# Patient Record
Sex: Female | Born: 1935 | Hispanic: No | Smoking: Former smoker
Health system: Southern US, Community
[De-identification: ages and names within clinical notes are randomized; demographics above are authoritative.]

## PROBLEM LIST (undated history)

## (undated) DIAGNOSIS — N390 Urinary tract infection, site not specified: Secondary | ICD-10-CM

## (undated) DIAGNOSIS — M542 Cervicalgia: Secondary | ICD-10-CM

## (undated) DIAGNOSIS — S72009A Fracture of unspecified part of neck of unspecified femur, initial encounter for closed fracture: Secondary | ICD-10-CM

## (undated) DIAGNOSIS — R1310 Dysphagia, unspecified: Secondary | ICD-10-CM

## (undated) DIAGNOSIS — I255 Ischemic cardiomyopathy: Secondary | ICD-10-CM

## (undated) DIAGNOSIS — F0390 Unspecified dementia without behavioral disturbance: Secondary | ICD-10-CM

## (undated) DIAGNOSIS — J8410 Pulmonary fibrosis, unspecified: Secondary | ICD-10-CM

## (undated) DIAGNOSIS — G4700 Insomnia, unspecified: Secondary | ICD-10-CM

## (undated) DIAGNOSIS — R82998 Other abnormal findings in urine: Secondary | ICD-10-CM

## (undated) DIAGNOSIS — R05 Cough: Secondary | ICD-10-CM

## (undated) DIAGNOSIS — M1990 Unspecified osteoarthritis, unspecified site: Secondary | ICD-10-CM

## (undated) DIAGNOSIS — Z79899 Other long term (current) drug therapy: Secondary | ICD-10-CM

## (undated) DIAGNOSIS — H3530 Unspecified macular degeneration: Secondary | ICD-10-CM

## (undated) DIAGNOSIS — I2510 Atherosclerotic heart disease of native coronary artery without angina pectoris: Secondary | ICD-10-CM

## (undated) DIAGNOSIS — Z8679 Personal history of other diseases of the circulatory system: Secondary | ICD-10-CM

## (undated) DIAGNOSIS — L989 Disorder of the skin and subcutaneous tissue, unspecified: Secondary | ICD-10-CM

## (undated) DIAGNOSIS — G459 Transient cerebral ischemic attack, unspecified: Secondary | ICD-10-CM

## (undated) DIAGNOSIS — J449 Chronic obstructive pulmonary disease, unspecified: Secondary | ICD-10-CM

## (undated) DIAGNOSIS — J189 Pneumonia, unspecified organism: Secondary | ICD-10-CM

## (undated) DIAGNOSIS — R634 Abnormal weight loss: Secondary | ICD-10-CM

## (undated) DIAGNOSIS — K5900 Constipation, unspecified: Secondary | ICD-10-CM

## (undated) DIAGNOSIS — IMO0002 Reserved for concepts with insufficient information to code with codable children: Secondary | ICD-10-CM

## (undated) DIAGNOSIS — Z9289 Personal history of other medical treatment: Secondary | ICD-10-CM

## (undated) DIAGNOSIS — W19XXXA Unspecified fall, initial encounter: Secondary | ICD-10-CM

## (undated) DIAGNOSIS — I509 Heart failure, unspecified: Secondary | ICD-10-CM

## (undated) DIAGNOSIS — K219 Gastro-esophageal reflux disease without esophagitis: Secondary | ICD-10-CM

## (undated) DIAGNOSIS — E7800 Pure hypercholesterolemia, unspecified: Secondary | ICD-10-CM

## (undated) DIAGNOSIS — R011 Cardiac murmur, unspecified: Secondary | ICD-10-CM

## (undated) DIAGNOSIS — L299 Pruritus, unspecified: Secondary | ICD-10-CM

## (undated) DIAGNOSIS — R269 Unspecified abnormalities of gait and mobility: Secondary | ICD-10-CM

## (undated) DIAGNOSIS — E785 Hyperlipidemia, unspecified: Secondary | ICD-10-CM

## (undated) HISTORY — DX: Reserved for concepts with insufficient information to code with codable children: IMO0002

## (undated) HISTORY — DX: Unspecified macular degeneration: H35.30

## (undated) HISTORY — PX: CHOLECYSTECTOMY: SHX55

## (undated) HISTORY — DX: Dysphagia, unspecified: R13.10

## (undated) HISTORY — DX: Hyperlipidemia, unspecified: E78.5

## (undated) HISTORY — DX: Disorder of the skin and subcutaneous tissue, unspecified: L98.9

## (undated) HISTORY — DX: Urinary tract infection, site not specified: N39.0

## (undated) HISTORY — DX: Pruritus, unspecified: L29.9

## (undated) HISTORY — DX: Cervicalgia: M54.2

## (undated) HISTORY — DX: Constipation, unspecified: K59.00

## (undated) HISTORY — DX: Pulmonary fibrosis, unspecified: J84.10

## (undated) HISTORY — DX: Abnormal weight loss: R63.4

## (undated) HISTORY — DX: Pneumonia, unspecified organism: J18.9

## (undated) HISTORY — DX: Other long term (current) drug therapy: Z79.899

## (undated) HISTORY — PX: OOPHORECTOMY: SHX86

## (undated) HISTORY — DX: Cardiac murmur, unspecified: R01.1

## (undated) HISTORY — DX: Chronic obstructive pulmonary disease, unspecified: J44.9

## (undated) HISTORY — DX: Atherosclerotic heart disease of native coronary artery without angina pectoris: I25.10

## (undated) HISTORY — DX: Cough: R05

## (undated) HISTORY — DX: Unspecified osteoarthritis, unspecified site: M19.90

## (undated) HISTORY — DX: Unspecified fall, initial encounter: W19.XXXA

## (undated) HISTORY — DX: Gastro-esophageal reflux disease without esophagitis: K21.9

## (undated) HISTORY — PX: SPINAL FUSION: SHX223

## (undated) HISTORY — DX: Unspecified dementia without behavioral disturbance: F03.90

## (undated) HISTORY — DX: Unspecified dementia, unspecified severity, without behavioral disturbance, psychotic disturbance, mood disturbance, and anxiety: F03.90

## (undated) HISTORY — DX: Insomnia, unspecified: G47.00

## (undated) HISTORY — DX: Heart failure, unspecified: I50.9

## (undated) HISTORY — DX: Fracture of unspecified part of neck of unspecified femur, initial encounter for closed fracture: S72.009A

## (undated) HISTORY — DX: Pure hypercholesterolemia, unspecified: E78.00

## (undated) HISTORY — DX: Personal history of other diseases of the circulatory system: Z86.79

## (undated) HISTORY — DX: Other abnormal findings in urine: R82.998

## (undated) HISTORY — DX: Ischemic cardiomyopathy: I25.5

## (undated) HISTORY — DX: Unspecified abnormalities of gait and mobility: R26.9

## (undated) HISTORY — DX: Personal history of other medical treatment: Z92.89

## (undated) HISTORY — DX: Transient cerebral ischemic attack, unspecified: G45.9

## (undated) HISTORY — PX: ABDOMINAL HYSTERECTOMY: SHX81

---

## 2001-08-18 ENCOUNTER — Emergency Department (HOSPITAL_COMMUNITY): Admission: EM | Admit: 2001-08-18 | Discharge: 2001-08-18 | Payer: Self-pay | Admitting: Emergency Medicine

## 2001-08-18 ENCOUNTER — Encounter: Payer: Self-pay | Admitting: Emergency Medicine

## 2003-04-22 ENCOUNTER — Encounter: Admission: RE | Admit: 2003-04-22 | Discharge: 2003-07-21 | Payer: Self-pay | Admitting: *Deleted

## 2003-04-24 ENCOUNTER — Ambulatory Visit (HOSPITAL_COMMUNITY): Admission: RE | Admit: 2003-04-24 | Discharge: 2003-04-24 | Payer: Self-pay | Admitting: *Deleted

## 2003-04-24 ENCOUNTER — Encounter: Payer: Self-pay | Admitting: Neurology

## 2003-12-28 ENCOUNTER — Inpatient Hospital Stay (HOSPITAL_COMMUNITY): Admission: EM | Admit: 2003-12-28 | Discharge: 2004-01-05 | Payer: Self-pay | Admitting: Emergency Medicine

## 2003-12-31 ENCOUNTER — Encounter: Payer: Self-pay | Admitting: Cardiology

## 2004-01-05 ENCOUNTER — Inpatient Hospital Stay: Admission: RE | Admit: 2004-01-05 | Discharge: 2004-01-14 | Payer: Self-pay | Admitting: Internal Medicine

## 2004-10-25 ENCOUNTER — Ambulatory Visit: Payer: Self-pay | Admitting: *Deleted

## 2005-02-19 ENCOUNTER — Ambulatory Visit: Payer: Self-pay | Admitting: *Deleted

## 2005-07-20 ENCOUNTER — Ambulatory Visit: Payer: Self-pay | Admitting: Cardiology

## 2005-12-12 ENCOUNTER — Ambulatory Visit: Payer: Self-pay | Admitting: Cardiology

## 2006-07-01 ENCOUNTER — Ambulatory Visit: Payer: Self-pay | Admitting: Cardiology

## 2006-10-21 ENCOUNTER — Ambulatory Visit: Payer: Self-pay | Admitting: Cardiology

## 2006-10-23 ENCOUNTER — Ambulatory Visit: Payer: Self-pay | Admitting: Internal Medicine

## 2006-11-01 ENCOUNTER — Ambulatory Visit: Payer: Self-pay | Admitting: Internal Medicine

## 2006-11-01 ENCOUNTER — Encounter: Admission: RE | Admit: 2006-11-01 | Discharge: 2006-11-01 | Payer: Self-pay | Admitting: Internal Medicine

## 2006-11-07 ENCOUNTER — Ambulatory Visit: Payer: Self-pay | Admitting: Internal Medicine

## 2006-11-18 ENCOUNTER — Ambulatory Visit: Payer: Self-pay | Admitting: Family Medicine

## 2006-12-30 ENCOUNTER — Ambulatory Visit: Payer: Self-pay | Admitting: Internal Medicine

## 2006-12-31 ENCOUNTER — Encounter: Payer: Self-pay | Admitting: Internal Medicine

## 2006-12-31 LAB — CONVERTED CEMR LAB
Hemoglobin, Urine: NEGATIVE
Specific Gravity, Urine: 1.012 (ref 1.005–1.03)
Urobilinogen, UA: 0.2 (ref 0.0–1.0)
pH: 6 (ref 5.0–8.0)

## 2007-01-01 ENCOUNTER — Encounter: Payer: Self-pay | Admitting: Internal Medicine

## 2007-05-01 ENCOUNTER — Ambulatory Visit: Payer: Self-pay | Admitting: Cardiology

## 2007-05-12 DIAGNOSIS — J4489 Other specified chronic obstructive pulmonary disease: Secondary | ICD-10-CM

## 2007-05-12 DIAGNOSIS — M1990 Unspecified osteoarthritis, unspecified site: Secondary | ICD-10-CM

## 2007-05-12 DIAGNOSIS — J449 Chronic obstructive pulmonary disease, unspecified: Secondary | ICD-10-CM

## 2007-05-12 DIAGNOSIS — I509 Heart failure, unspecified: Secondary | ICD-10-CM

## 2007-05-12 HISTORY — DX: Unspecified osteoarthritis, unspecified site: M19.90

## 2007-05-12 HISTORY — DX: Other specified chronic obstructive pulmonary disease: J44.89

## 2007-05-12 HISTORY — DX: Heart failure, unspecified: I50.9

## 2007-05-12 HISTORY — DX: Chronic obstructive pulmonary disease, unspecified: J44.9

## 2007-05-16 ENCOUNTER — Encounter (INDEPENDENT_AMBULATORY_CARE_PROVIDER_SITE_OTHER): Payer: Self-pay | Admitting: *Deleted

## 2007-05-22 ENCOUNTER — Ambulatory Visit: Payer: Self-pay | Admitting: Internal Medicine

## 2007-05-22 DIAGNOSIS — J8410 Pulmonary fibrosis, unspecified: Secondary | ICD-10-CM | POA: Insufficient documentation

## 2007-05-22 DIAGNOSIS — H3530 Unspecified macular degeneration: Secondary | ICD-10-CM

## 2007-05-22 DIAGNOSIS — E785 Hyperlipidemia, unspecified: Secondary | ICD-10-CM | POA: Insufficient documentation

## 2007-05-22 DIAGNOSIS — N390 Urinary tract infection, site not specified: Secondary | ICD-10-CM | POA: Insufficient documentation

## 2007-05-22 HISTORY — DX: Urinary tract infection, site not specified: N39.0

## 2007-05-22 HISTORY — DX: Pulmonary fibrosis, unspecified: J84.10

## 2007-05-22 HISTORY — DX: Hyperlipidemia, unspecified: E78.5

## 2007-05-22 HISTORY — DX: Unspecified macular degeneration: H35.30

## 2007-05-22 LAB — CONVERTED CEMR LAB
Glucose, Urine, Semiquant: NEGATIVE
Ketones, urine, test strip: NEGATIVE
Protein, U semiquant: NEGATIVE
Urobilinogen, UA: NEGATIVE
pH: 5

## 2007-05-23 ENCOUNTER — Encounter: Payer: Self-pay | Admitting: Internal Medicine

## 2007-05-28 ENCOUNTER — Telehealth: Payer: Self-pay | Admitting: Internal Medicine

## 2007-08-15 ENCOUNTER — Ambulatory Visit: Payer: Self-pay | Admitting: Internal Medicine

## 2007-08-15 DIAGNOSIS — F0390 Unspecified dementia without behavioral disturbance: Secondary | ICD-10-CM

## 2007-08-15 HISTORY — DX: Unspecified dementia, unspecified severity, without behavioral disturbance, psychotic disturbance, mood disturbance, and anxiety: F03.90

## 2007-08-15 LAB — CONVERTED CEMR LAB
Bilirubin Urine: NEGATIVE
Nitrite: NEGATIVE
Protein, U semiquant: NEGATIVE
Specific Gravity, Urine: 1.01
Urobilinogen, UA: NEGATIVE
pH: 6

## 2007-08-18 LAB — CONVERTED CEMR LAB
ALT: 13 units/L (ref 0–35)
AST: 18 units/L (ref 0–37)
Calcium: 10.2 mg/dL (ref 8.4–10.5)
HCT: 43.3 % (ref 36.0–46.0)
HDL: 37 mg/dL — ABNORMAL LOW (ref >39)
Hemoglobin: 14.1 g/dL (ref 12.0–15.0)
Lymphocytes Relative: 24 % (ref 12–46)
Monocytes Relative: 8 % (ref 3–11)
Neutrophils Relative %: 66 % (ref 43–77)
Potassium: 5 meq/L (ref 3.5–5.3)
RDW: 13.8 % (ref 11.5–14.0)
Sodium: 142 meq/L (ref 135–145)
TSH: 1.581 microintl units/mL (ref 0.350–5.50)
Total CHOL/HDL Ratio: 4.4
WBC: 9.2 10*3/uL (ref 4.0–10.5)

## 2007-08-25 ENCOUNTER — Telehealth (INDEPENDENT_AMBULATORY_CARE_PROVIDER_SITE_OTHER): Payer: Self-pay | Admitting: *Deleted

## 2007-09-04 ENCOUNTER — Ambulatory Visit: Payer: Self-pay | Admitting: Cardiology

## 2007-09-29 ENCOUNTER — Ambulatory Visit: Payer: Self-pay | Admitting: Internal Medicine

## 2007-09-29 LAB — CONVERTED CEMR LAB
Bilirubin Urine: NEGATIVE
Blood in Urine, dipstick: NEGATIVE
Glucose, Urine, Semiquant: NEGATIVE
Ketones, urine, test strip: NEGATIVE
Specific Gravity, Urine: 1.01

## 2007-10-24 ENCOUNTER — Ambulatory Visit: Payer: Self-pay | Admitting: Internal Medicine

## 2007-10-25 ENCOUNTER — Encounter: Payer: Self-pay | Admitting: Internal Medicine

## 2007-10-29 ENCOUNTER — Telehealth: Payer: Self-pay | Admitting: Internal Medicine

## 2007-11-04 ENCOUNTER — Telehealth: Payer: Self-pay | Admitting: Internal Medicine

## 2007-12-12 ENCOUNTER — Ambulatory Visit: Payer: Self-pay | Admitting: Internal Medicine

## 2007-12-12 LAB — CONVERTED CEMR LAB
Bilirubin Urine: NEGATIVE
Blood in Urine, dipstick: NEGATIVE
Nitrite: NEGATIVE
Protein, U semiquant: NEGATIVE
Specific Gravity, Urine: 1.015
Urobilinogen, UA: NEGATIVE
WBC Urine, dipstick: NEGATIVE

## 2007-12-16 ENCOUNTER — Encounter (INDEPENDENT_AMBULATORY_CARE_PROVIDER_SITE_OTHER): Payer: Self-pay | Admitting: *Deleted

## 2007-12-16 LAB — CONVERTED CEMR LAB
BUN: 19 mg/dL (ref 6–23)
CO2: 31 meq/L (ref 19–32)
Calcium: 10.6 mg/dL — ABNORMAL HIGH (ref 8.4–10.5)
Chloride: 102 meq/L (ref 96–112)
Creatinine, Ser: 1 mg/dL (ref 0.4–1.2)
Glucose, Bld: 110 mg/dL — ABNORMAL HIGH (ref 70–99)

## 2007-12-29 ENCOUNTER — Telehealth: Payer: Self-pay | Admitting: Internal Medicine

## 2007-12-31 ENCOUNTER — Ambulatory Visit: Payer: Self-pay | Admitting: Internal Medicine

## 2007-12-31 DIAGNOSIS — K219 Gastro-esophageal reflux disease without esophagitis: Secondary | ICD-10-CM

## 2007-12-31 HISTORY — DX: Gastro-esophageal reflux disease without esophagitis: K21.9

## 2008-01-16 ENCOUNTER — Ambulatory Visit: Payer: Self-pay | Admitting: Internal Medicine

## 2008-01-16 LAB — CONVERTED CEMR LAB
Bacteria, UA: NONE SEEN
Bilirubin Urine: NEGATIVE
Ketones, urine, test strip: NEGATIVE
Nitrite: NEGATIVE
Protein, U semiquant: NEGATIVE
RBC / HPF: NONE SEEN (ref <3)
Urobilinogen, UA: NEGATIVE

## 2008-01-17 ENCOUNTER — Encounter: Payer: Self-pay | Admitting: Internal Medicine

## 2008-01-20 ENCOUNTER — Telehealth (INDEPENDENT_AMBULATORY_CARE_PROVIDER_SITE_OTHER): Payer: Self-pay | Admitting: *Deleted

## 2008-01-23 ENCOUNTER — Encounter: Payer: Self-pay | Admitting: Internal Medicine

## 2008-02-05 ENCOUNTER — Telehealth: Payer: Self-pay | Admitting: Internal Medicine

## 2008-02-12 ENCOUNTER — Telehealth: Payer: Self-pay | Admitting: Internal Medicine

## 2008-03-01 ENCOUNTER — Ambulatory Visit: Payer: Self-pay | Admitting: Cardiology

## 2008-03-09 ENCOUNTER — Encounter: Payer: Self-pay | Admitting: Internal Medicine

## 2008-03-15 ENCOUNTER — Telehealth: Payer: Self-pay | Admitting: Internal Medicine

## 2008-03-31 ENCOUNTER — Encounter: Payer: Self-pay | Admitting: Internal Medicine

## 2008-04-02 ENCOUNTER — Encounter: Payer: Self-pay | Admitting: Internal Medicine

## 2008-04-02 ENCOUNTER — Encounter (INDEPENDENT_AMBULATORY_CARE_PROVIDER_SITE_OTHER): Payer: Self-pay | Admitting: *Deleted

## 2008-04-02 ENCOUNTER — Telehealth (INDEPENDENT_AMBULATORY_CARE_PROVIDER_SITE_OTHER): Payer: Self-pay | Admitting: *Deleted

## 2008-04-02 LAB — CONVERTED CEMR LAB
Glucose, Urine, Semiquant: NEGATIVE
Ketones, urine, test strip: NEGATIVE
Nitrite: POSITIVE
Specific Gravity, Urine: <1.005
pH: 7

## 2008-04-03 ENCOUNTER — Encounter: Payer: Self-pay | Admitting: Internal Medicine

## 2008-04-03 LAB — CONVERTED CEMR LAB
Bilirubin Urine: NEGATIVE
Ketones, ur: NEGATIVE mg/dL
Nitrite: POSITIVE — AB
Protein, ur: NEGATIVE mg/dL
RBC / HPF: NONE SEEN (ref <3)
Specific Gravity, Urine: 1.013 (ref 1.005–1.03)
Urobilinogen, UA: 0.2 (ref 0.0–1.0)

## 2008-04-08 ENCOUNTER — Telehealth (INDEPENDENT_AMBULATORY_CARE_PROVIDER_SITE_OTHER): Payer: Self-pay | Admitting: *Deleted

## 2008-04-28 ENCOUNTER — Ambulatory Visit: Payer: Self-pay | Admitting: Internal Medicine

## 2008-04-28 DIAGNOSIS — G4700 Insomnia, unspecified: Secondary | ICD-10-CM | POA: Insufficient documentation

## 2008-04-28 HISTORY — DX: Insomnia, unspecified: G47.00

## 2008-04-28 LAB — CONVERTED CEMR LAB
Bilirubin Urine: NEGATIVE
Ketones, urine, test strip: NEGATIVE
Nitrite: NEGATIVE
Protein, U semiquant: NEGATIVE
Specific Gravity, Urine: <1.005
Urobilinogen, UA: 0.2
pH: 5

## 2008-05-03 ENCOUNTER — Encounter (INDEPENDENT_AMBULATORY_CARE_PROVIDER_SITE_OTHER): Payer: Self-pay | Admitting: *Deleted

## 2008-05-03 LAB — CONVERTED CEMR LAB
Calcium: 10.1 mg/dL (ref 8.4–10.5)
Creatinine, Ser: 0.8 mg/dL (ref 0.4–1.2)
Folate: >20 ng/mL
GFR calc Af Amer: 88 mL/min
GFR calc non Af Amer: 72 mL/min
Hemoglobin: 13.2 g/dL (ref 12.0–15.0)
TSH: 1.86 microintl units/mL (ref 0.35–5.50)
Total Bilirubin: 0.7 mg/dL (ref 0.3–1.2)
Vitamin B-12: 326 pg/mL (ref 211–911)

## 2008-05-10 ENCOUNTER — Encounter: Payer: Self-pay | Admitting: Internal Medicine

## 2008-05-10 ENCOUNTER — Telehealth: Payer: Self-pay | Admitting: Internal Medicine

## 2008-05-13 ENCOUNTER — Telehealth (INDEPENDENT_AMBULATORY_CARE_PROVIDER_SITE_OTHER): Payer: Self-pay | Admitting: *Deleted

## 2008-05-18 ENCOUNTER — Encounter: Payer: Self-pay | Admitting: Internal Medicine

## 2008-05-18 ENCOUNTER — Telehealth (INDEPENDENT_AMBULATORY_CARE_PROVIDER_SITE_OTHER): Payer: Self-pay | Admitting: *Deleted

## 2008-06-11 ENCOUNTER — Encounter: Payer: Self-pay | Admitting: Internal Medicine

## 2008-06-14 ENCOUNTER — Telehealth (INDEPENDENT_AMBULATORY_CARE_PROVIDER_SITE_OTHER): Payer: Self-pay | Admitting: *Deleted

## 2008-06-14 ENCOUNTER — Encounter: Payer: Self-pay | Admitting: Internal Medicine

## 2008-06-14 ENCOUNTER — Ambulatory Visit: Payer: Self-pay | Admitting: Family Medicine

## 2008-06-14 LAB — CONVERTED CEMR LAB
Bilirubin Urine: NEGATIVE
Ketones, urine, test strip: NEGATIVE
Specific Gravity, Urine: 1.01

## 2008-06-15 ENCOUNTER — Encounter: Payer: Self-pay | Admitting: Internal Medicine

## 2008-06-15 LAB — CONVERTED CEMR LAB

## 2008-06-17 ENCOUNTER — Telehealth: Payer: Self-pay | Admitting: Internal Medicine

## 2008-06-21 ENCOUNTER — Telehealth (INDEPENDENT_AMBULATORY_CARE_PROVIDER_SITE_OTHER): Payer: Self-pay | Admitting: *Deleted

## 2008-06-28 ENCOUNTER — Encounter: Payer: Self-pay | Admitting: Internal Medicine

## 2008-07-07 ENCOUNTER — Ambulatory Visit: Payer: Self-pay | Admitting: Internal Medicine

## 2008-07-08 ENCOUNTER — Encounter: Payer: Self-pay | Admitting: Internal Medicine

## 2008-07-08 LAB — CONVERTED CEMR LAB: RBC / HPF: NONE SEEN (ref <3)

## 2008-07-12 ENCOUNTER — Telehealth (INDEPENDENT_AMBULATORY_CARE_PROVIDER_SITE_OTHER): Payer: Self-pay | Admitting: *Deleted

## 2008-07-13 ENCOUNTER — Encounter: Payer: Self-pay | Admitting: Internal Medicine

## 2008-07-20 ENCOUNTER — Encounter: Payer: Self-pay | Admitting: Internal Medicine

## 2008-07-20 HISTORY — PX: HIP FRACTURE SURGERY: SHX118

## 2008-07-27 ENCOUNTER — Telehealth (INDEPENDENT_AMBULATORY_CARE_PROVIDER_SITE_OTHER): Payer: Self-pay | Admitting: *Deleted

## 2008-07-30 ENCOUNTER — Encounter: Payer: Self-pay | Admitting: Internal Medicine

## 2008-08-01 ENCOUNTER — Ambulatory Visit: Payer: Self-pay | Admitting: Internal Medicine

## 2008-08-01 ENCOUNTER — Inpatient Hospital Stay (HOSPITAL_COMMUNITY): Admission: EM | Admit: 2008-08-01 | Discharge: 2008-08-06 | Payer: Self-pay | Admitting: Emergency Medicine

## 2008-08-01 DIAGNOSIS — I2510 Atherosclerotic heart disease of native coronary artery without angina pectoris: Secondary | ICD-10-CM

## 2008-08-01 DIAGNOSIS — S72009A Fracture of unspecified part of neck of unspecified femur, initial encounter for closed fracture: Secondary | ICD-10-CM | POA: Insufficient documentation

## 2008-08-01 HISTORY — DX: Atherosclerotic heart disease of native coronary artery without angina pectoris: I25.10

## 2008-08-04 ENCOUNTER — Encounter: Payer: Self-pay | Admitting: Internal Medicine

## 2008-08-09 ENCOUNTER — Encounter: Payer: Self-pay | Admitting: Internal Medicine

## 2008-08-12 ENCOUNTER — Encounter: Payer: Self-pay | Admitting: Internal Medicine

## 2008-08-19 ENCOUNTER — Inpatient Hospital Stay (HOSPITAL_COMMUNITY): Admission: EM | Admit: 2008-08-19 | Discharge: 2008-08-24 | Payer: Self-pay | Admitting: Emergency Medicine

## 2008-08-25 ENCOUNTER — Encounter: Payer: Self-pay | Admitting: Internal Medicine

## 2008-08-30 ENCOUNTER — Encounter (INDEPENDENT_AMBULATORY_CARE_PROVIDER_SITE_OTHER): Payer: Self-pay | Admitting: *Deleted

## 2008-08-30 ENCOUNTER — Encounter: Payer: Self-pay | Admitting: Internal Medicine

## 2008-09-20 ENCOUNTER — Encounter: Payer: Self-pay | Admitting: Internal Medicine

## 2008-09-29 ENCOUNTER — Ambulatory Visit: Payer: Self-pay | Admitting: Family Medicine

## 2008-09-29 DIAGNOSIS — R05 Cough: Secondary | ICD-10-CM

## 2008-09-29 DIAGNOSIS — R059 Cough, unspecified: Secondary | ICD-10-CM

## 2008-09-29 HISTORY — DX: Cough, unspecified: R05.9

## 2008-09-30 ENCOUNTER — Telehealth: Payer: Self-pay | Admitting: Internal Medicine

## 2008-10-07 ENCOUNTER — Ambulatory Visit: Payer: Self-pay | Admitting: Internal Medicine

## 2008-10-07 LAB — CONVERTED CEMR LAB
Glucose, Urine, Semiquant: NEGATIVE
Ketones, urine, test strip: NEGATIVE
pH: 5

## 2008-10-08 ENCOUNTER — Encounter: Payer: Self-pay | Admitting: Internal Medicine

## 2008-10-15 ENCOUNTER — Encounter: Payer: Self-pay | Admitting: Internal Medicine

## 2008-10-20 ENCOUNTER — Ambulatory Visit: Payer: Self-pay | Admitting: Internal Medicine

## 2008-10-22 ENCOUNTER — Ambulatory Visit: Payer: Self-pay | Admitting: Internal Medicine

## 2008-10-25 ENCOUNTER — Telehealth: Payer: Self-pay | Admitting: Internal Medicine

## 2008-10-25 LAB — CONVERTED CEMR LAB
Basophils Absolute: 0 10*3/uL (ref 0.0–0.1)
Calcium: 10.2 mg/dL (ref 8.4–10.5)
Eosinophils Absolute: 0.1 10*3/uL (ref 0.0–0.7)
GFR calc Af Amer: 122 mL/min
GFR calc non Af Amer: 101 mL/min
HCT: 38.8 % (ref 36.0–46.0)
Hemoglobin: 13.8 g/dL (ref 12.0–15.0)
MCHC: 35.5 g/dL (ref 30.0–36.0)
MCV: 89.7 fL (ref 78.0–100.0)
Monocytes Absolute: 0.7 10*3/uL (ref 0.1–1.0)
Monocytes Relative: 6 % (ref 3.0–12.0)
Neutro Abs: 9.6 10*3/uL — ABNORMAL HIGH (ref 1.4–7.7)
Platelets: 220 10*3/uL (ref 150–400)
Potassium: 4.2 meq/L (ref 3.5–5.1)
RDW: 15.5 % — ABNORMAL HIGH (ref 11.5–14.6)
Sodium: 136 meq/L (ref 135–145)

## 2008-10-26 ENCOUNTER — Ambulatory Visit: Payer: Self-pay | Admitting: Internal Medicine

## 2008-10-27 ENCOUNTER — Encounter: Payer: Self-pay | Admitting: Internal Medicine

## 2008-10-28 ENCOUNTER — Telehealth (INDEPENDENT_AMBULATORY_CARE_PROVIDER_SITE_OTHER): Payer: Self-pay | Admitting: *Deleted

## 2008-10-28 LAB — CONVERTED CEMR LAB

## 2008-11-04 ENCOUNTER — Encounter: Payer: Self-pay | Admitting: Internal Medicine

## 2008-11-30 ENCOUNTER — Telehealth (INDEPENDENT_AMBULATORY_CARE_PROVIDER_SITE_OTHER): Payer: Self-pay | Admitting: *Deleted

## 2008-12-07 ENCOUNTER — Telehealth (INDEPENDENT_AMBULATORY_CARE_PROVIDER_SITE_OTHER): Payer: Self-pay | Admitting: *Deleted

## 2008-12-09 ENCOUNTER — Telehealth: Payer: Self-pay | Admitting: Internal Medicine

## 2008-12-22 ENCOUNTER — Ambulatory Visit: Payer: Self-pay | Admitting: Cardiology

## 2009-01-13 ENCOUNTER — Telehealth (INDEPENDENT_AMBULATORY_CARE_PROVIDER_SITE_OTHER): Payer: Self-pay | Admitting: *Deleted

## 2009-01-17 ENCOUNTER — Encounter: Payer: Self-pay | Admitting: Internal Medicine

## 2009-01-26 ENCOUNTER — Ambulatory Visit: Payer: Self-pay | Admitting: Internal Medicine

## 2009-01-26 LAB — CONVERTED CEMR LAB
Nitrite: NEGATIVE
Protein, U semiquant: NEGATIVE
RBC / HPF: NONE SEEN (ref <3)
Specific Gravity, Urine: 1.015
Urobilinogen, UA: 0.2
pH: 5

## 2009-01-27 ENCOUNTER — Encounter: Payer: Self-pay | Admitting: Internal Medicine

## 2009-02-01 ENCOUNTER — Telehealth: Payer: Self-pay | Admitting: Internal Medicine

## 2009-02-01 ENCOUNTER — Telehealth (INDEPENDENT_AMBULATORY_CARE_PROVIDER_SITE_OTHER): Payer: Self-pay | Admitting: *Deleted

## 2009-02-02 ENCOUNTER — Encounter: Payer: Self-pay | Admitting: Internal Medicine

## 2009-02-04 ENCOUNTER — Ambulatory Visit: Payer: Self-pay | Admitting: Internal Medicine

## 2009-02-14 ENCOUNTER — Telehealth (INDEPENDENT_AMBULATORY_CARE_PROVIDER_SITE_OTHER): Payer: Self-pay | Admitting: *Deleted

## 2009-02-17 ENCOUNTER — Telehealth (INDEPENDENT_AMBULATORY_CARE_PROVIDER_SITE_OTHER): Payer: Self-pay | Admitting: *Deleted

## 2009-02-24 ENCOUNTER — Telehealth: Payer: Self-pay | Admitting: Internal Medicine

## 2009-02-25 ENCOUNTER — Ambulatory Visit: Payer: Self-pay | Admitting: Internal Medicine

## 2009-02-25 ENCOUNTER — Inpatient Hospital Stay (HOSPITAL_COMMUNITY): Admission: EM | Admit: 2009-02-25 | Discharge: 2009-03-03 | Payer: Self-pay | Admitting: Emergency Medicine

## 2009-02-25 ENCOUNTER — Encounter: Payer: Self-pay | Admitting: Internal Medicine

## 2009-03-17 ENCOUNTER — Telehealth: Payer: Self-pay | Admitting: Internal Medicine

## 2009-04-07 ENCOUNTER — Ambulatory Visit: Payer: Self-pay | Admitting: Internal Medicine

## 2009-04-22 ENCOUNTER — Ambulatory Visit: Payer: Self-pay | Admitting: Internal Medicine

## 2009-04-25 ENCOUNTER — Encounter: Payer: Self-pay | Admitting: Internal Medicine

## 2009-04-27 ENCOUNTER — Ambulatory Visit: Payer: Self-pay | Admitting: Internal Medicine

## 2009-04-27 LAB — CONVERTED CEMR LAB
Bilirubin Urine: NEGATIVE
Blood in Urine, dipstick: NEGATIVE
Ketones, urine, test strip: NEGATIVE
Nitrite: NEGATIVE
Urobilinogen, UA: 0.2
pH: 5

## 2009-04-28 ENCOUNTER — Encounter (INDEPENDENT_AMBULATORY_CARE_PROVIDER_SITE_OTHER): Payer: Self-pay | Admitting: *Deleted

## 2009-04-28 ENCOUNTER — Encounter: Payer: Self-pay | Admitting: Internal Medicine

## 2009-05-01 ENCOUNTER — Telehealth: Payer: Self-pay | Admitting: Family Medicine

## 2009-05-01 ENCOUNTER — Encounter: Payer: Self-pay | Admitting: Internal Medicine

## 2009-05-02 ENCOUNTER — Telehealth: Payer: Self-pay | Admitting: Internal Medicine

## 2009-05-02 ENCOUNTER — Encounter: Payer: Self-pay | Admitting: Internal Medicine

## 2009-05-02 LAB — CONVERTED CEMR LAB: WBC, UA: NONE SEEN cells/hpf (ref <3)

## 2009-05-03 ENCOUNTER — Encounter: Payer: Self-pay | Admitting: Internal Medicine

## 2009-05-04 ENCOUNTER — Encounter: Payer: Self-pay | Admitting: Internal Medicine

## 2009-05-06 ENCOUNTER — Telehealth: Payer: Self-pay | Admitting: Internal Medicine

## 2009-05-11 ENCOUNTER — Encounter: Payer: Self-pay | Admitting: Internal Medicine

## 2009-05-13 ENCOUNTER — Encounter: Payer: Self-pay | Admitting: Internal Medicine

## 2009-05-19 ENCOUNTER — Encounter: Payer: Self-pay | Admitting: Internal Medicine

## 2009-05-27 ENCOUNTER — Encounter: Payer: Self-pay | Admitting: Internal Medicine

## 2009-05-30 ENCOUNTER — Ambulatory Visit: Payer: Self-pay | Admitting: Internal Medicine

## 2009-05-30 DIAGNOSIS — L299 Pruritus, unspecified: Secondary | ICD-10-CM

## 2009-05-30 DIAGNOSIS — R634 Abnormal weight loss: Secondary | ICD-10-CM

## 2009-05-30 HISTORY — DX: Pruritus, unspecified: L29.9

## 2009-05-30 HISTORY — DX: Abnormal weight loss: R63.4

## 2009-06-03 ENCOUNTER — Encounter: Payer: Self-pay | Admitting: Internal Medicine

## 2009-06-07 ENCOUNTER — Encounter: Payer: Self-pay | Admitting: Internal Medicine

## 2009-07-13 ENCOUNTER — Encounter: Payer: Self-pay | Admitting: Internal Medicine

## 2009-08-10 ENCOUNTER — Encounter: Payer: Self-pay | Admitting: Internal Medicine

## 2009-08-10 ENCOUNTER — Telehealth: Payer: Self-pay | Admitting: Internal Medicine

## 2009-08-12 DIAGNOSIS — M542 Cervicalgia: Secondary | ICD-10-CM

## 2009-08-12 DIAGNOSIS — Z8679 Personal history of other diseases of the circulatory system: Secondary | ICD-10-CM

## 2009-08-12 HISTORY — DX: Personal history of other diseases of the circulatory system: Z86.79

## 2009-08-12 HISTORY — DX: Cervicalgia: M54.2

## 2009-08-15 ENCOUNTER — Ambulatory Visit: Payer: Self-pay | Admitting: Cardiology

## 2009-08-29 ENCOUNTER — Encounter: Payer: Self-pay | Admitting: Internal Medicine

## 2009-08-29 ENCOUNTER — Telehealth: Payer: Self-pay | Admitting: Internal Medicine

## 2009-08-29 DIAGNOSIS — R82998 Other abnormal findings in urine: Secondary | ICD-10-CM

## 2009-08-29 HISTORY — DX: Other abnormal findings in urine: R82.998

## 2009-09-02 ENCOUNTER — Telehealth (INDEPENDENT_AMBULATORY_CARE_PROVIDER_SITE_OTHER): Payer: Self-pay | Admitting: *Deleted

## 2009-09-14 ENCOUNTER — Encounter: Payer: Self-pay | Admitting: Internal Medicine

## 2009-09-29 ENCOUNTER — Encounter (INDEPENDENT_AMBULATORY_CARE_PROVIDER_SITE_OTHER): Payer: Self-pay | Admitting: *Deleted

## 2009-10-06 ENCOUNTER — Emergency Department (HOSPITAL_COMMUNITY): Admission: EM | Admit: 2009-10-06 | Discharge: 2009-10-06 | Payer: Self-pay | Admitting: Emergency Medicine

## 2009-10-06 ENCOUNTER — Telehealth (INDEPENDENT_AMBULATORY_CARE_PROVIDER_SITE_OTHER): Payer: Self-pay | Admitting: *Deleted

## 2009-10-06 ENCOUNTER — Encounter: Payer: Self-pay | Admitting: Internal Medicine

## 2009-10-07 ENCOUNTER — Telehealth: Payer: Self-pay | Admitting: Internal Medicine

## 2009-10-27 ENCOUNTER — Encounter: Payer: Self-pay | Admitting: Internal Medicine

## 2009-11-07 ENCOUNTER — Ambulatory Visit: Payer: Self-pay | Admitting: Family

## 2009-11-07 DIAGNOSIS — K5900 Constipation, unspecified: Secondary | ICD-10-CM | POA: Insufficient documentation

## 2009-11-07 DIAGNOSIS — R1310 Dysphagia, unspecified: Secondary | ICD-10-CM

## 2009-11-07 DIAGNOSIS — R011 Cardiac murmur, unspecified: Secondary | ICD-10-CM

## 2009-11-07 HISTORY — DX: Dysphagia, unspecified: R13.10

## 2009-11-07 HISTORY — DX: Constipation, unspecified: K59.00

## 2009-11-07 HISTORY — DX: Cardiac murmur, unspecified: R01.1

## 2009-11-09 ENCOUNTER — Encounter: Payer: Self-pay | Admitting: Internal Medicine

## 2009-11-19 DIAGNOSIS — Z9289 Personal history of other medical treatment: Secondary | ICD-10-CM

## 2009-11-19 HISTORY — DX: Personal history of other medical treatment: Z92.89

## 2009-11-23 ENCOUNTER — Telehealth: Payer: Self-pay | Admitting: Internal Medicine

## 2009-11-24 ENCOUNTER — Encounter: Payer: Self-pay | Admitting: Internal Medicine

## 2009-11-24 ENCOUNTER — Ambulatory Visit: Payer: Self-pay | Admitting: Internal Medicine

## 2009-11-24 ENCOUNTER — Ambulatory Visit (HOSPITAL_COMMUNITY): Admission: RE | Admit: 2009-11-24 | Discharge: 2009-11-24 | Payer: Self-pay | Admitting: Family

## 2009-11-24 ENCOUNTER — Ambulatory Visit: Payer: Self-pay

## 2009-11-25 ENCOUNTER — Telehealth: Payer: Self-pay | Admitting: Internal Medicine

## 2009-12-02 ENCOUNTER — Encounter: Payer: Self-pay | Admitting: Cardiology

## 2009-12-05 ENCOUNTER — Encounter: Payer: Self-pay | Admitting: Family

## 2009-12-05 ENCOUNTER — Ambulatory Visit (HOSPITAL_COMMUNITY): Admission: RE | Admit: 2009-12-05 | Discharge: 2009-12-05 | Payer: Self-pay | Admitting: Internal Medicine

## 2009-12-06 ENCOUNTER — Encounter: Payer: Self-pay | Admitting: Internal Medicine

## 2009-12-06 ENCOUNTER — Ambulatory Visit: Payer: Self-pay | Admitting: Cardiology

## 2009-12-13 ENCOUNTER — Encounter: Payer: Self-pay | Admitting: Internal Medicine

## 2009-12-14 ENCOUNTER — Encounter (INDEPENDENT_AMBULATORY_CARE_PROVIDER_SITE_OTHER): Payer: Self-pay | Admitting: *Deleted

## 2009-12-14 ENCOUNTER — Ambulatory Visit: Payer: Self-pay | Admitting: Internal Medicine

## 2009-12-19 ENCOUNTER — Ambulatory Visit: Payer: Self-pay | Admitting: Internal Medicine

## 2009-12-20 ENCOUNTER — Encounter: Payer: Self-pay | Admitting: Internal Medicine

## 2009-12-21 LAB — CONVERTED CEMR LAB
Alkaline Phosphatase: 31 units/L — ABNORMAL LOW (ref 39–117)
BUN: 8 mg/dL (ref 6–23)
Bilirubin, Direct: 0 mg/dL (ref 0.0–0.3)
CO2: 29 meq/L (ref 19–32)
Chloride: 102 meq/L (ref 96–112)
Creatinine, Ser: 0.7 mg/dL (ref 0.4–1.2)
Glucose, Bld: 99 mg/dL (ref 70–99)
HDL: 39.1 mg/dL (ref >39.00)
Potassium: 3.8 meq/L (ref 3.5–5.1)
TSH: 1.17 microintl units/mL (ref 0.35–5.50)
Total Bilirubin: 0.7 mg/dL (ref 0.3–1.2)
VLDL: 40.4 mg/dL — ABNORMAL HIGH (ref 0.0–40.0)

## 2009-12-27 ENCOUNTER — Telehealth: Payer: Self-pay | Admitting: Internal Medicine

## 2009-12-30 ENCOUNTER — Encounter: Payer: Self-pay | Admitting: Internal Medicine

## 2010-01-11 ENCOUNTER — Encounter: Payer: Self-pay | Admitting: Internal Medicine

## 2010-01-20 ENCOUNTER — Telehealth: Payer: Self-pay | Admitting: Internal Medicine

## 2010-01-27 ENCOUNTER — Ambulatory Visit: Payer: Self-pay | Admitting: Internal Medicine

## 2010-01-27 DIAGNOSIS — L989 Disorder of the skin and subcutaneous tissue, unspecified: Secondary | ICD-10-CM

## 2010-01-27 HISTORY — DX: Disorder of the skin and subcutaneous tissue, unspecified: L98.9

## 2010-01-27 LAB — CONVERTED CEMR LAB
Bilirubin Urine: NEGATIVE
Blood in Urine, dipstick: NEGATIVE
Glucose, Urine, Semiquant: NEGATIVE
Ketones, urine, test strip: NEGATIVE
Protein, U semiquant: NEGATIVE
Specific Gravity, Urine: <1.005

## 2010-01-28 ENCOUNTER — Encounter: Payer: Self-pay | Admitting: Internal Medicine

## 2010-01-28 LAB — CONVERTED CEMR LAB: Crystals: NONE SEEN

## 2010-01-30 ENCOUNTER — Encounter (INDEPENDENT_AMBULATORY_CARE_PROVIDER_SITE_OTHER): Payer: Self-pay | Admitting: *Deleted

## 2010-02-04 IMAGING — CT CT CERVICAL SPINE W/O CM
4 of 5 series · 16 of 33 positions shown, 19 images · non-contrast
Comparison: None

CT HEAD

CLINICAL DATA: Status post fall

CT HEAD WITHOUT CONTRAST
CT CERVICAL SPINE WITHOUT CONTRAST
TECHNIQUE: Multidetector CT imaging of the head and cervical spine
was performed following the standard protocol without intravenous
contrast.  Multiplanar CT image reconstructions of the cervical
spine were also generated.

[Series 4: c_spine 2.0 b31s · axial · 0.23mm/px · z∈[-224,-172]mm · 2 of 78 slices shown]
[im 26/78  bone]
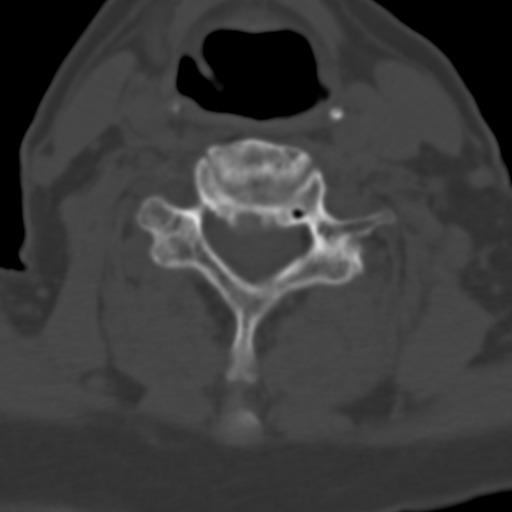
[im 52/78  bone]
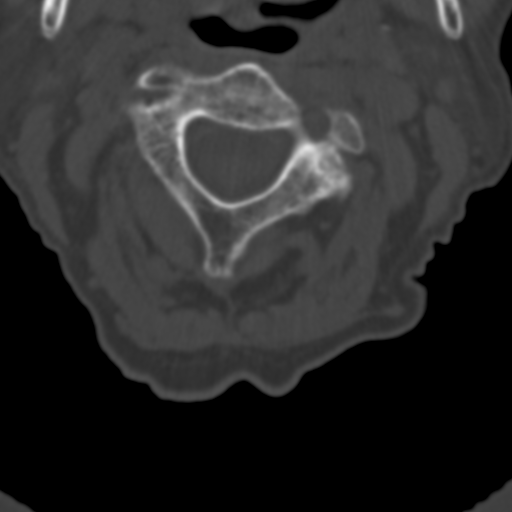

[Series 602: axial cervical · axial · 0.30mm/px · z∈[-279,-179]mm · 6 of 152 slices shown, 8 images]
[im 22/152  soft-tissue]
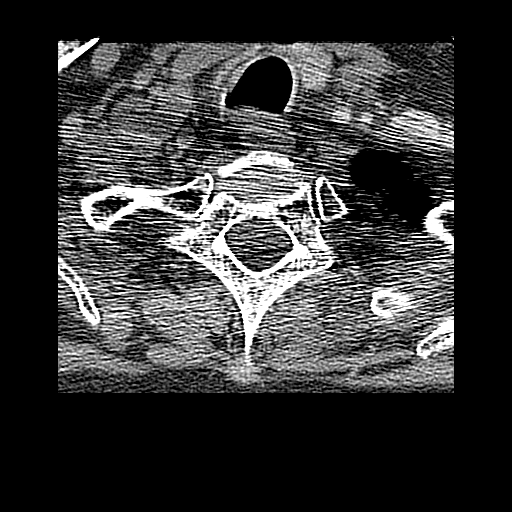
[im 22/152  bone]
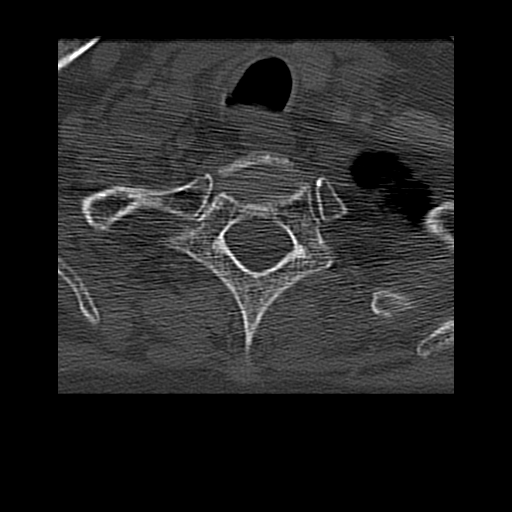
[im 44/152  bone]
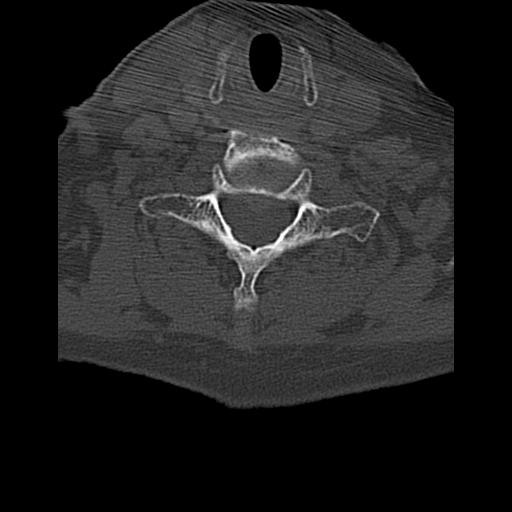
[im 65/152  bone]
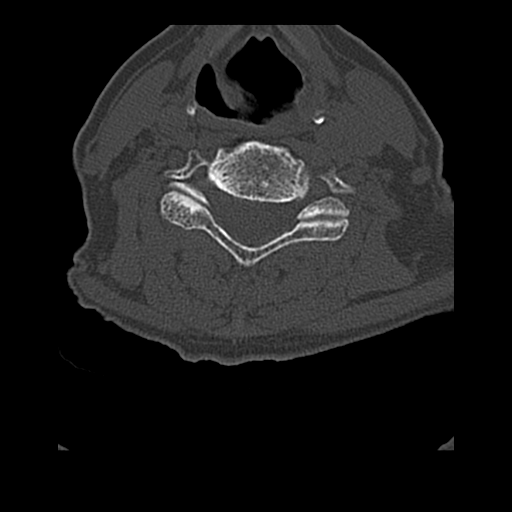
[im 87/152  bone]
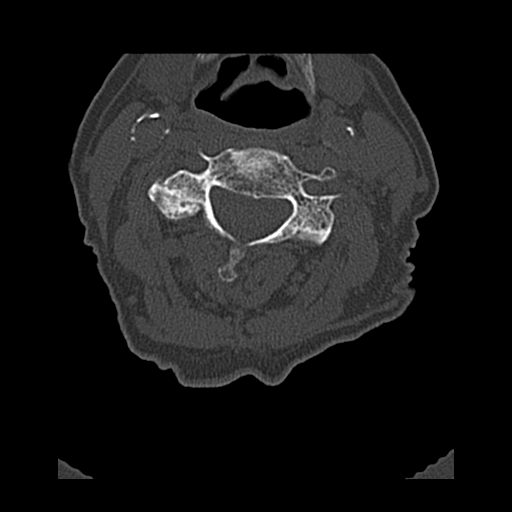
[im 108/152  soft-tissue]
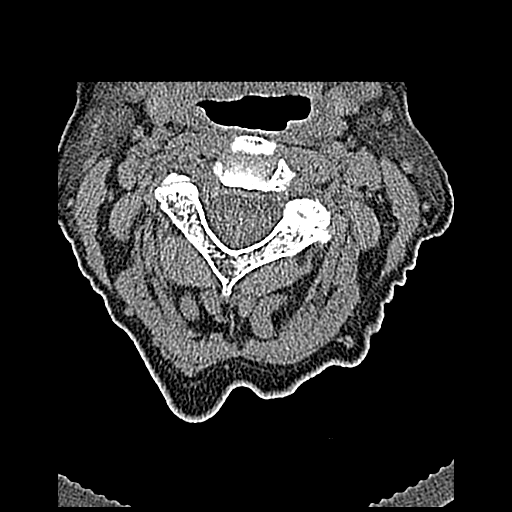
[im 108/152  bone]
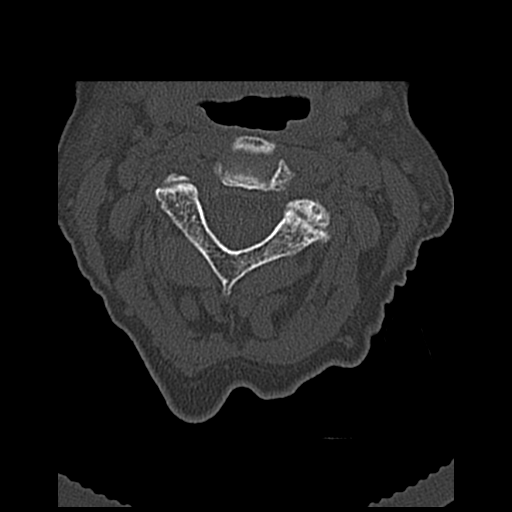
[im 130/152  bone]
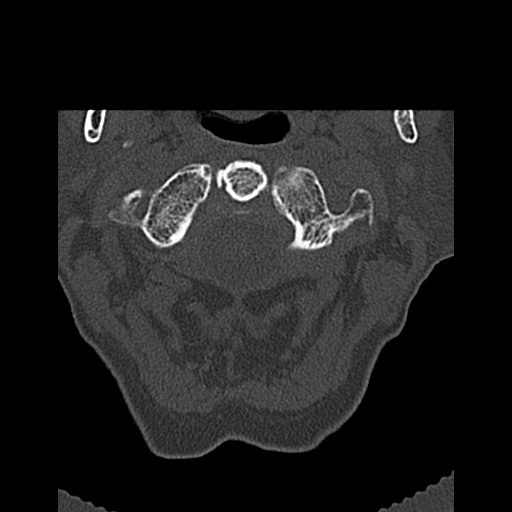

[Series 603: coronal cervical · coronal · 0.30mm/px · 3 of 61 slices shown]
[im 13/61  bone]
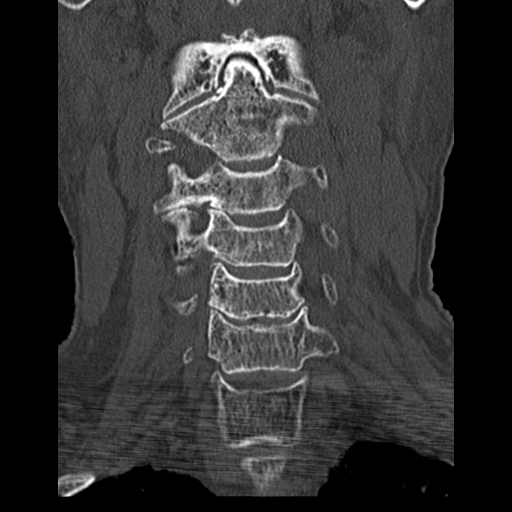
[im 25/61  bone]
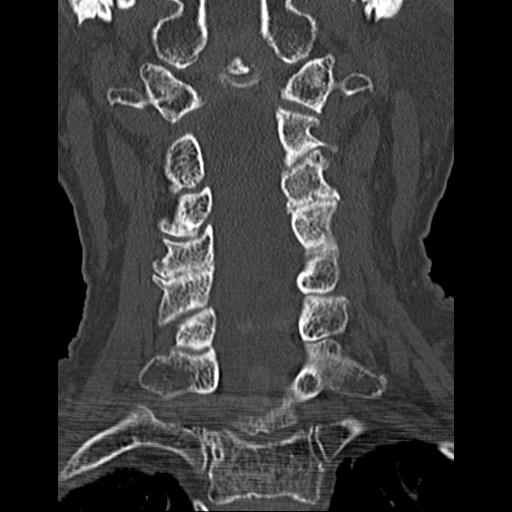
[im 37/61  bone]
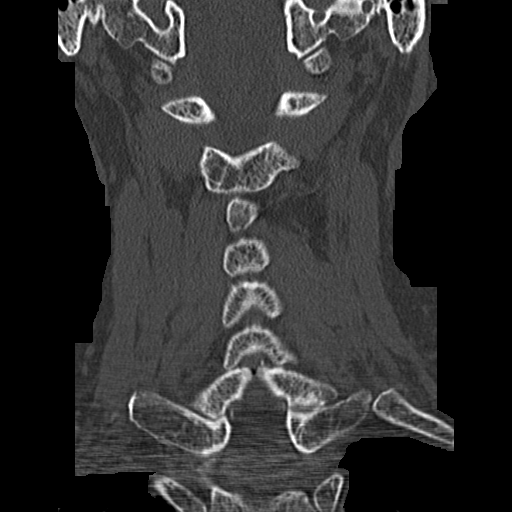

[Series 604: sagittal cervical · sagittal · 0.30mm/px · 5 of 61 slices shown, 6 images]
[im 21/61  bone]
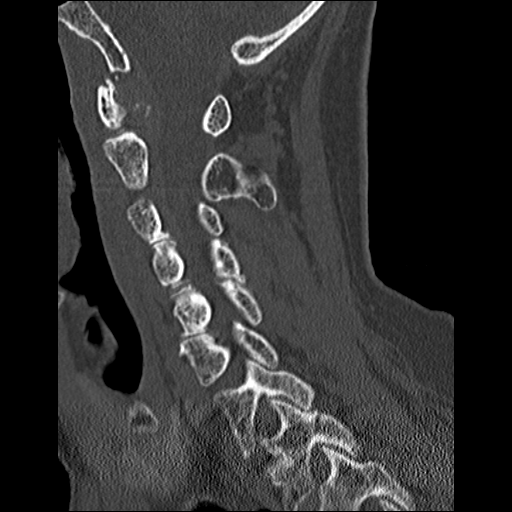
[im 26/61  bone]
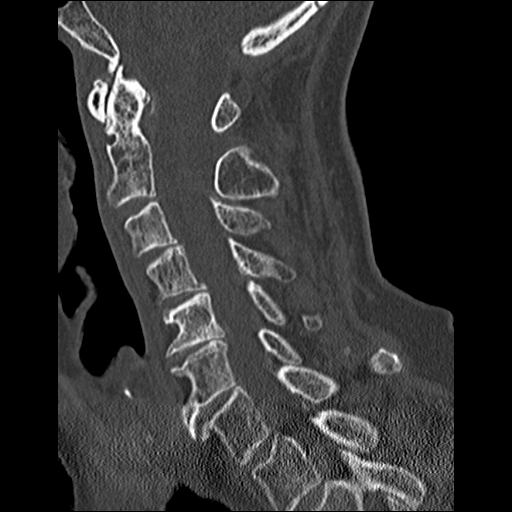
[im 31/61  soft-tissue]
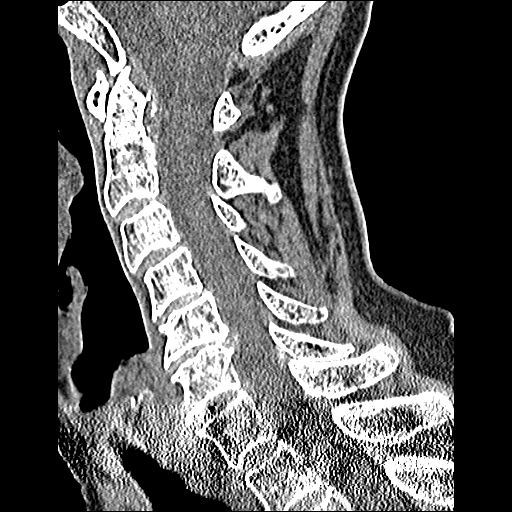
[im 31/61  bone]
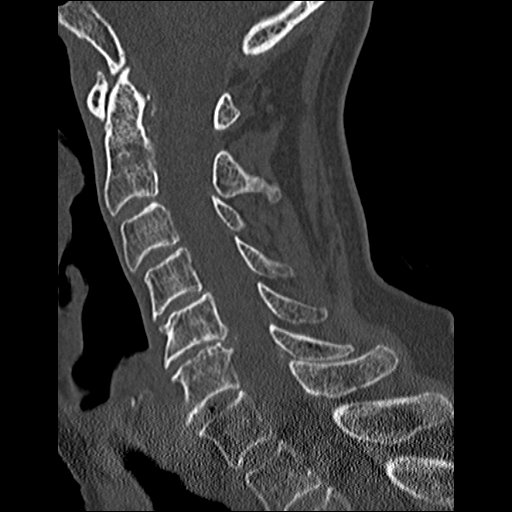
[im 36/61  bone]
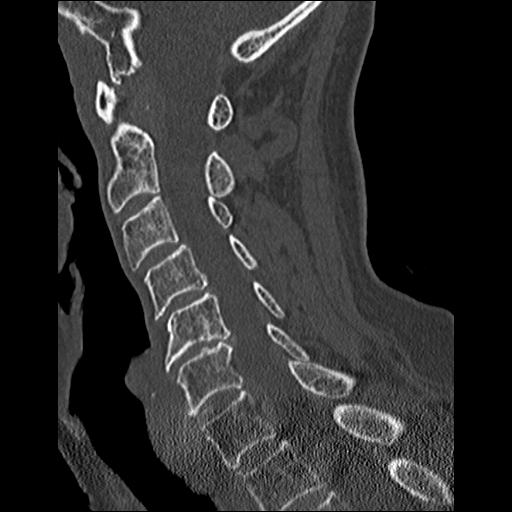
[im 41/61  bone]
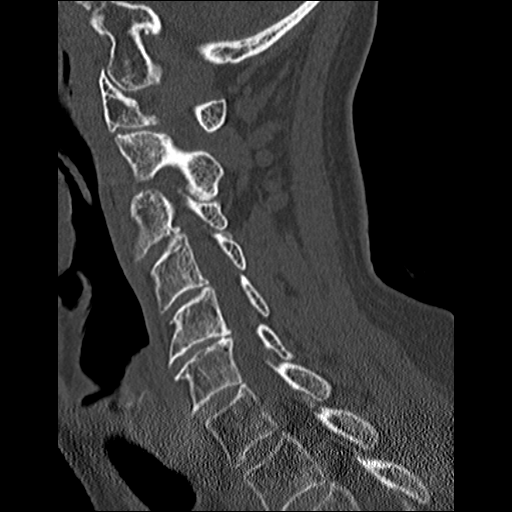

[16 of 33 positions shown; findings below may reference images not displayed]

FINDINGS: Accelerated atrophy.  The ventricles and basilar cisterns
are midline without effacement or mass effect.  Periventricular
small vessel ischemic changes are noted.  There is no acute
intracranial hemorrhage or edema.  No extraaxial fluid
collections..  Soft tissue swelling along the left calvarium.  No
underlying fracture. Mastoid air cells and sinuses are clear
IMPRESSION: Negative for acute intracranial hemorrhage or edema

CT CERVICAL SPINE
FINDINGS: The prevertebral soft tissues are normal.  Degenerative
disc disease is notable at the C5-C6 level and to a lesser extent,
at C4-C5 and C6-C7.  Facet osteoarthritic changes involve the mid
cervical spine.  No fracture or subluxation is identified.  The
craniocervical and the cervical thoracic junctions are intact.
IMPRESSION: Negative for fracture, subluxation or static signs of instability.

## 2010-02-04 IMAGING — CR DG HIP (WITH OR WITHOUT PELVIS) 2-3V*L*
3 series · 3 of 3 positions shown · non-contrast
Comparison: None

CLINICAL DATA: Fall

LEFT HIP - COMPLETE 2+ VIEW

[t hip ap left]
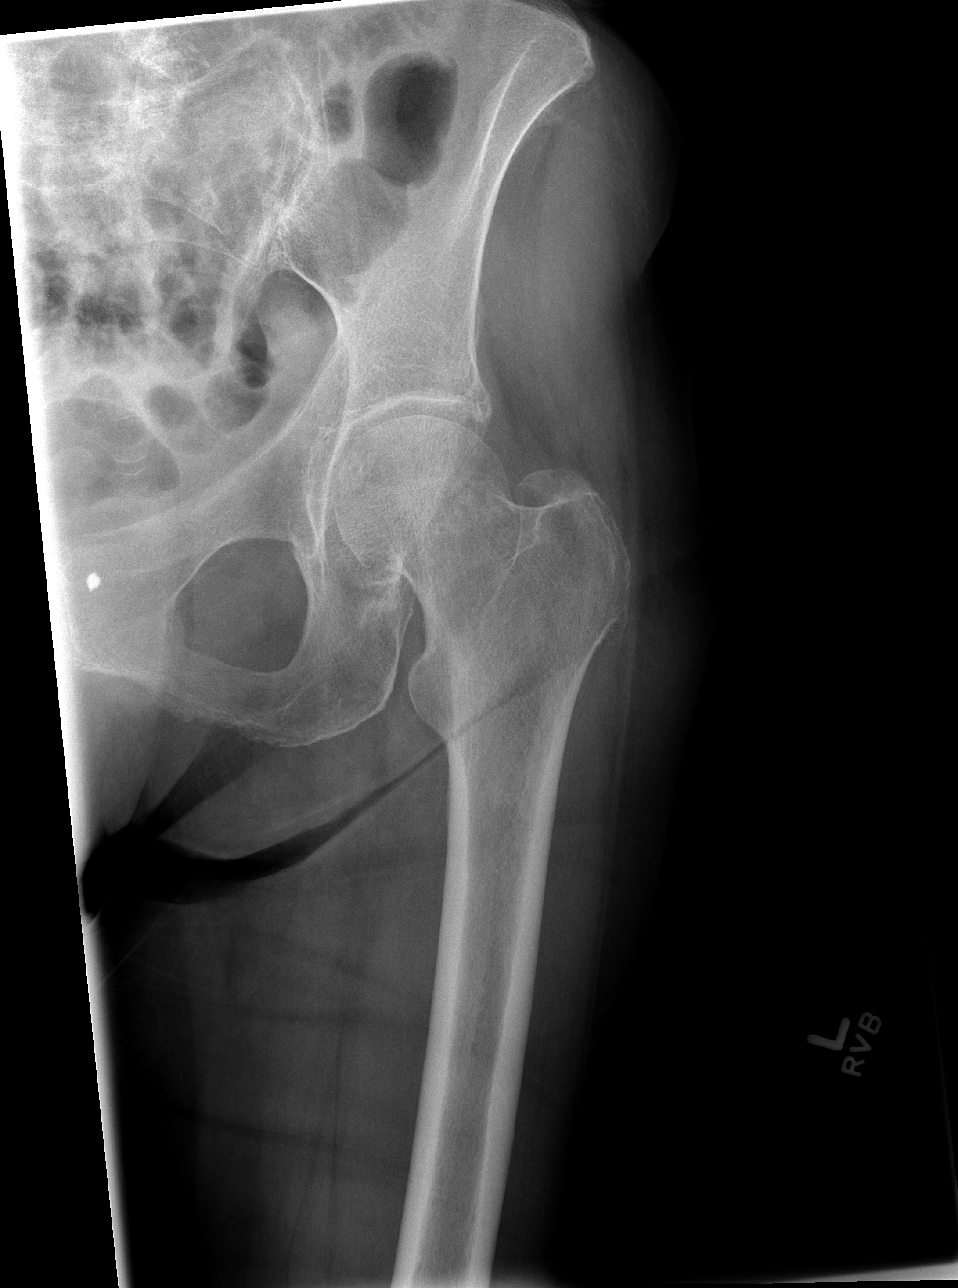

[t hip frog leg left *]
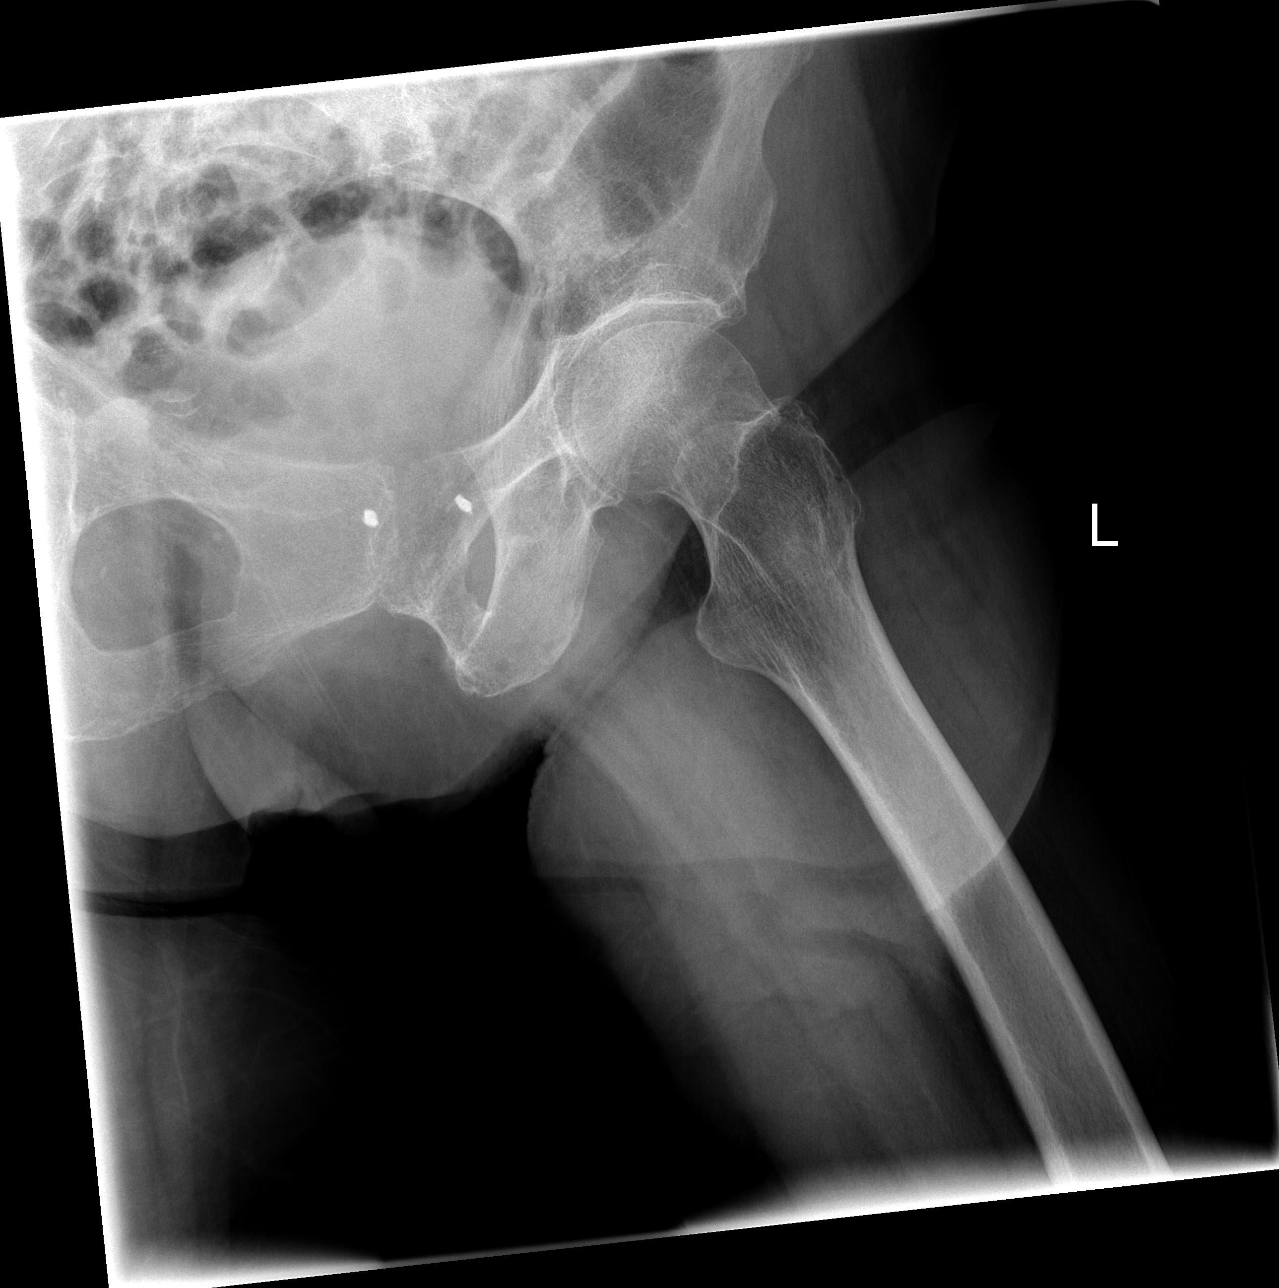

[t pelvis a.p.]
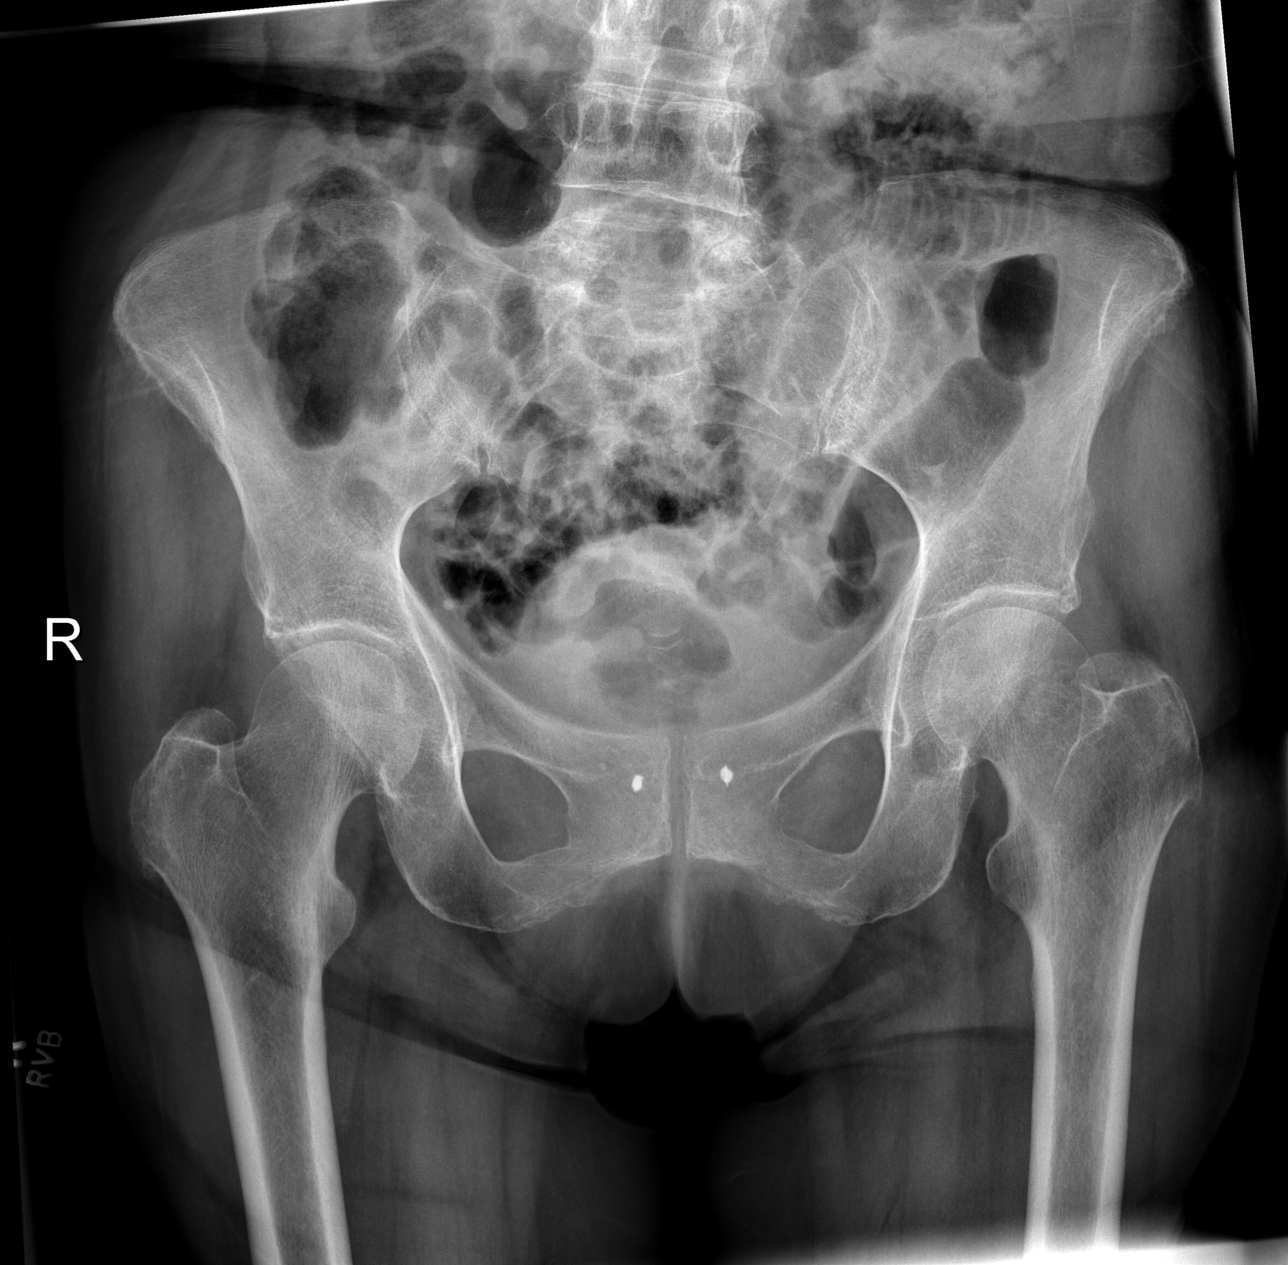

[3 of 3 positions shown; findings below may reference images not displayed]

FINDINGS: No definite acute fracture or dislocation.  Nonspecific
sclerosis is present in the neck of the left proximal femur.  There
is no definite cortical step off for deformity.  Metallic objects
are present in the pubic bodies bilaterally of unknown
significance.  Bones are demineralized.
IMPRESSION: No acute bony injury.  As clinically indicated, CT or MRI can be
performed

## 2010-02-04 IMAGING — CR DG CHEST 2V
2 series · 2 of 2 positions shown · non-contrast
Comparison: 11/01/2006

CLINICAL DATA: Fall

CHEST - 2 VIEW

[w chest lat]
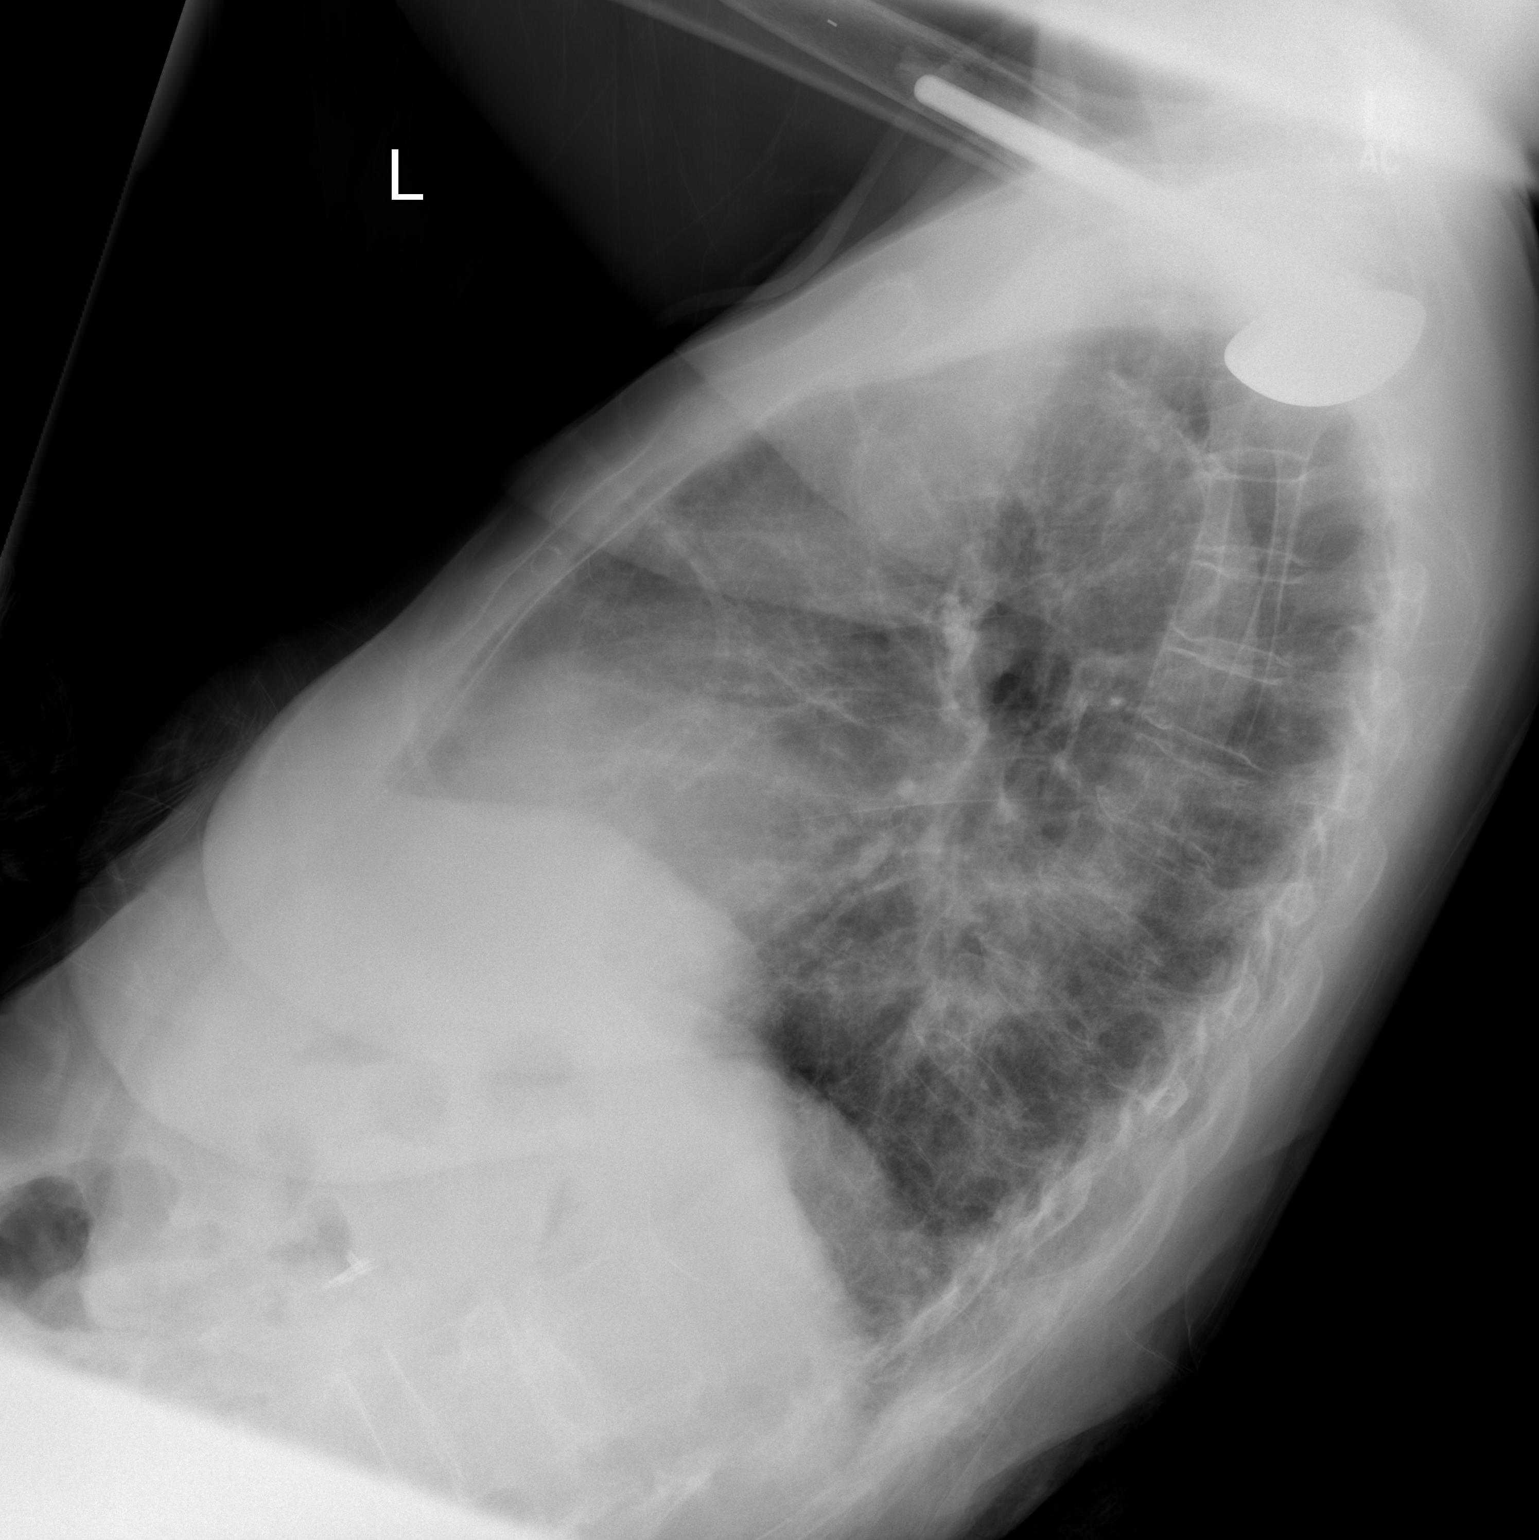

[view not recorded]
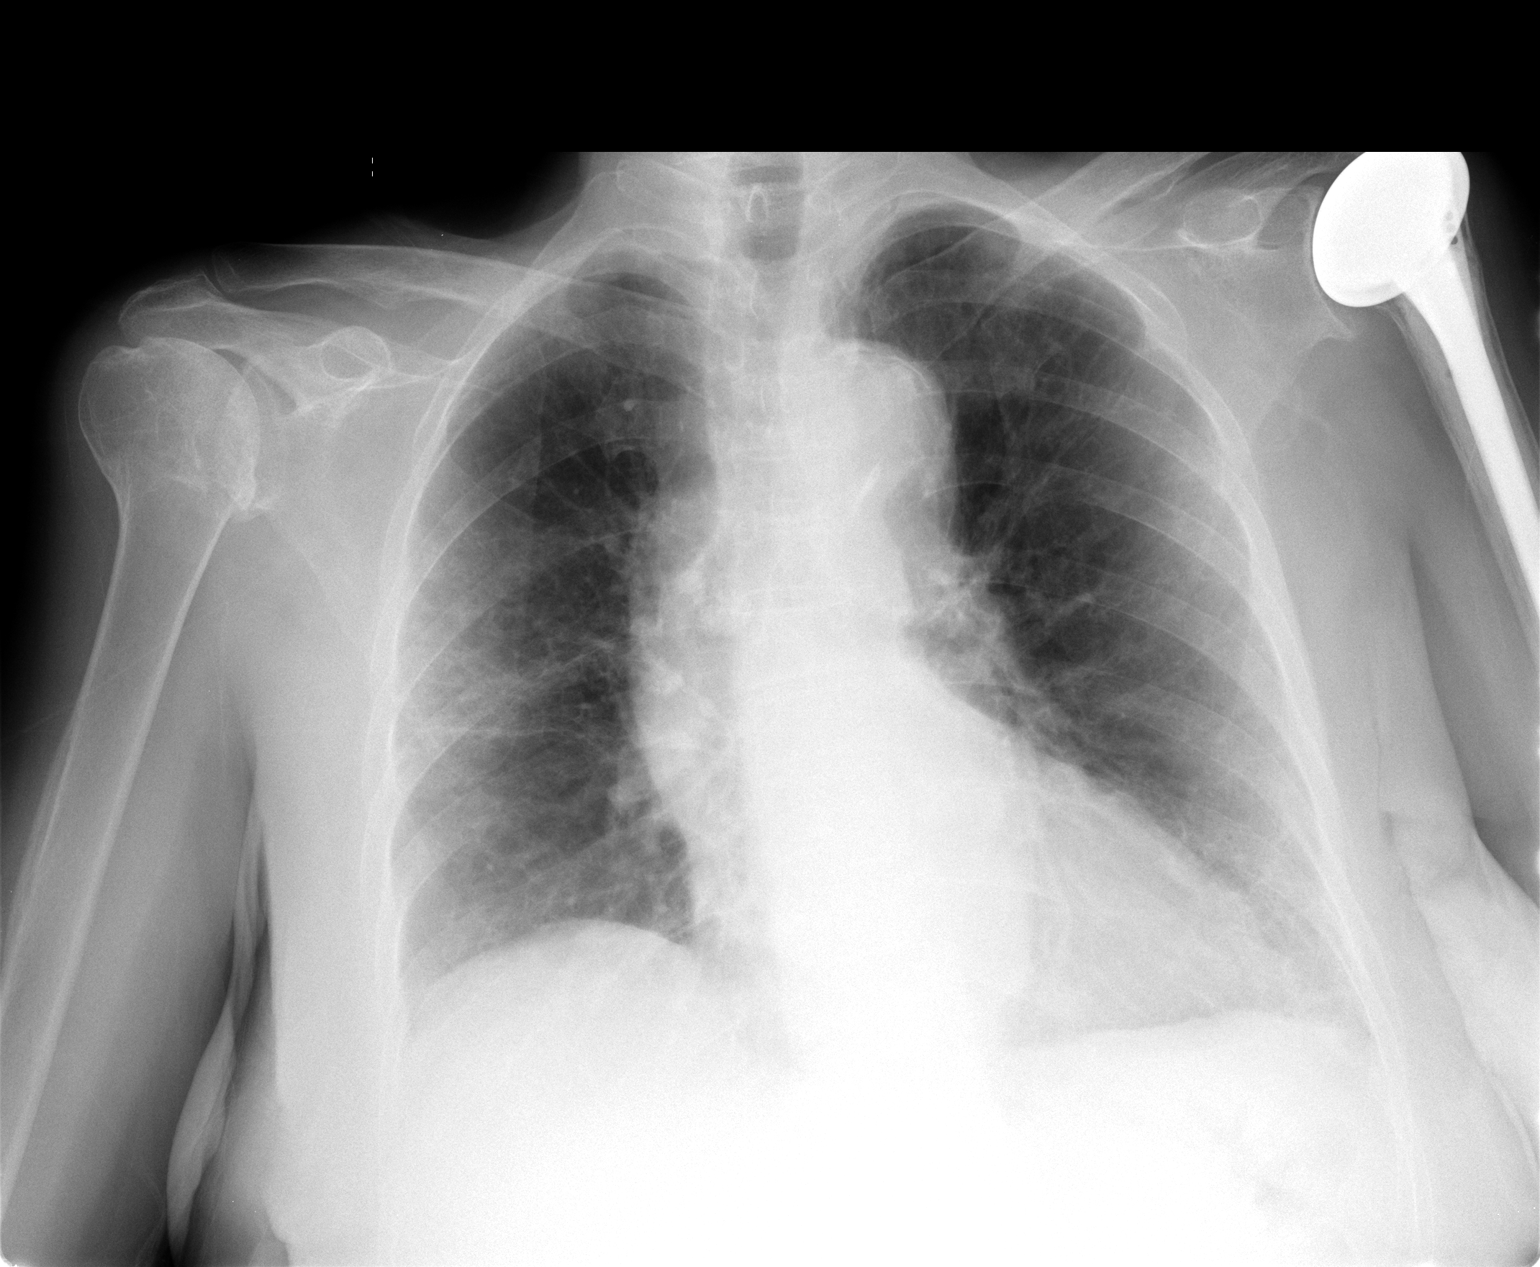

[2 of 2 positions shown; findings below may reference images not displayed]

FINDINGS: The heart remains mildly enlarged.  Pulmonary vascularity
is within normal limits.  Increased patchy densities are present in
the superior segment of the right lower lobe laterally worrisome
for developing airspace disease.  The aorta remains tortuous.  No
pneumothorax or effusion.
IMPRESSION: Patchy airspace opacities in the superior segment of the right
lower lobe.  Consider pulmonary contusion.

## 2010-02-07 ENCOUNTER — Encounter: Payer: Self-pay | Admitting: Internal Medicine

## 2010-02-28 ENCOUNTER — Ambulatory Visit: Payer: Self-pay | Admitting: Internal Medicine

## 2010-03-01 ENCOUNTER — Encounter: Payer: Self-pay | Admitting: Internal Medicine

## 2010-03-01 LAB — CONVERTED CEMR LAB
Casts: NONE SEEN /lpf
Crystals: NONE SEEN
Hemoglobin, Urine: NEGATIVE
Specific Gravity, Urine: 1.013 (ref 1.005–1.030)
Urine Glucose: NEGATIVE mg/dL
pH: 7 (ref 5.0–8.0)

## 2010-03-06 ENCOUNTER — Encounter: Payer: Self-pay | Admitting: Internal Medicine

## 2010-03-06 ENCOUNTER — Telehealth: Payer: Self-pay | Admitting: Internal Medicine

## 2010-03-29 ENCOUNTER — Encounter: Payer: Self-pay | Admitting: Internal Medicine

## 2010-04-13 ENCOUNTER — Telehealth: Payer: Self-pay | Admitting: Internal Medicine

## 2010-04-20 ENCOUNTER — Telehealth (INDEPENDENT_AMBULATORY_CARE_PROVIDER_SITE_OTHER): Payer: Self-pay | Admitting: *Deleted

## 2010-04-28 ENCOUNTER — Encounter: Payer: Self-pay | Admitting: Internal Medicine

## 2010-05-01 ENCOUNTER — Telehealth: Payer: Self-pay | Admitting: Internal Medicine

## 2010-05-02 ENCOUNTER — Encounter: Payer: Self-pay | Admitting: Internal Medicine

## 2010-05-08 ENCOUNTER — Telehealth (INDEPENDENT_AMBULATORY_CARE_PROVIDER_SITE_OTHER): Payer: Self-pay | Admitting: *Deleted

## 2010-05-12 ENCOUNTER — Telehealth: Payer: Self-pay | Admitting: Internal Medicine

## 2010-05-12 ENCOUNTER — Encounter: Payer: Self-pay | Admitting: Internal Medicine

## 2010-05-24 ENCOUNTER — Telehealth: Payer: Self-pay | Admitting: Internal Medicine

## 2010-05-25 ENCOUNTER — Encounter: Payer: Self-pay | Admitting: Internal Medicine

## 2010-05-31 ENCOUNTER — Telehealth: Payer: Self-pay | Admitting: Internal Medicine

## 2010-06-05 ENCOUNTER — Telehealth: Payer: Self-pay | Admitting: Internal Medicine

## 2010-06-06 ENCOUNTER — Encounter: Payer: Self-pay | Admitting: Internal Medicine

## 2010-06-06 ENCOUNTER — Telehealth (INDEPENDENT_AMBULATORY_CARE_PROVIDER_SITE_OTHER): Payer: Self-pay | Admitting: *Deleted

## 2010-06-08 ENCOUNTER — Telehealth (INDEPENDENT_AMBULATORY_CARE_PROVIDER_SITE_OTHER): Payer: Self-pay | Admitting: *Deleted

## 2010-06-12 ENCOUNTER — Telehealth (INDEPENDENT_AMBULATORY_CARE_PROVIDER_SITE_OTHER): Payer: Self-pay | Admitting: *Deleted

## 2010-06-20 ENCOUNTER — Ambulatory Visit: Payer: Self-pay | Admitting: Internal Medicine

## 2010-06-20 DIAGNOSIS — M810 Age-related osteoporosis without current pathological fracture: Secondary | ICD-10-CM | POA: Insufficient documentation

## 2010-06-20 DIAGNOSIS — IMO0002 Reserved for concepts with insufficient information to code with codable children: Secondary | ICD-10-CM | POA: Insufficient documentation

## 2010-06-20 HISTORY — DX: Reserved for concepts with insufficient information to code with codable children: IMO0002

## 2010-06-20 LAB — CONVERTED CEMR LAB
Protein, U semiquant: NEGATIVE
Urobilinogen, UA: 0.2
WBC Urine, dipstick: NEGATIVE

## 2010-06-21 ENCOUNTER — Encounter: Payer: Self-pay | Admitting: Internal Medicine

## 2010-06-21 LAB — CONVERTED CEMR LAB
Casts: NONE SEEN /lpf
Crystals: NONE SEEN
WBC, UA: NONE SEEN cells/hpf (ref <3)

## 2010-06-23 ENCOUNTER — Telehealth (INDEPENDENT_AMBULATORY_CARE_PROVIDER_SITE_OTHER): Payer: Self-pay | Admitting: *Deleted

## 2010-06-26 ENCOUNTER — Telehealth: Payer: Self-pay | Admitting: Internal Medicine

## 2010-06-26 LAB — CONVERTED CEMR LAB
Calcium: 10.2 mg/dL (ref 8.4–10.5)
Creatinine, Ser: 0.8 mg/dL (ref 0.4–1.2)
GFR calc non Af Amer: 74.13 mL/min (ref >60)
Glucose, Bld: 119 mg/dL — ABNORMAL HIGH (ref 70–99)
Sodium: 134 meq/L — ABNORMAL LOW (ref 135–145)

## 2010-06-28 ENCOUNTER — Encounter: Admission: RE | Admit: 2010-06-28 | Discharge: 2010-06-28 | Payer: Self-pay | Admitting: Internal Medicine

## 2010-07-03 ENCOUNTER — Encounter: Payer: Self-pay | Admitting: Internal Medicine

## 2010-07-05 ENCOUNTER — Ambulatory Visit (HOSPITAL_COMMUNITY): Admission: RE | Admit: 2010-07-05 | Discharge: 2010-07-05 | Payer: Self-pay | Admitting: Interventional Radiology

## 2010-07-05 ENCOUNTER — Encounter: Payer: Self-pay | Admitting: Internal Medicine

## 2010-07-11 ENCOUNTER — Encounter: Payer: Self-pay | Admitting: Internal Medicine

## 2010-07-18 ENCOUNTER — Encounter: Payer: Self-pay | Admitting: Internal Medicine

## 2010-07-18 ENCOUNTER — Encounter: Payer: Self-pay | Admitting: Interventional Radiology

## 2010-08-09 ENCOUNTER — Encounter: Payer: Self-pay | Admitting: Interventional Radiology

## 2010-08-09 ENCOUNTER — Encounter: Payer: Self-pay | Admitting: Internal Medicine

## 2010-08-11 ENCOUNTER — Telehealth: Payer: Self-pay | Admitting: Internal Medicine

## 2010-08-11 ENCOUNTER — Emergency Department (HOSPITAL_COMMUNITY): Admission: EM | Admit: 2010-08-11 | Discharge: 2010-08-11 | Payer: Self-pay | Admitting: Emergency Medicine

## 2010-08-17 ENCOUNTER — Telehealth: Payer: Self-pay | Admitting: Internal Medicine

## 2010-08-17 ENCOUNTER — Ambulatory Visit: Payer: Self-pay | Admitting: Internal Medicine

## 2010-08-17 DIAGNOSIS — R269 Unspecified abnormalities of gait and mobility: Secondary | ICD-10-CM | POA: Insufficient documentation

## 2010-08-17 HISTORY — DX: Unspecified abnormalities of gait and mobility: R26.9

## 2010-08-22 ENCOUNTER — Telehealth: Payer: Self-pay | Admitting: Internal Medicine

## 2010-08-30 ENCOUNTER — Encounter: Payer: Self-pay | Admitting: Internal Medicine

## 2010-09-01 ENCOUNTER — Encounter: Payer: Self-pay | Admitting: Internal Medicine

## 2010-09-04 ENCOUNTER — Encounter (INDEPENDENT_AMBULATORY_CARE_PROVIDER_SITE_OTHER): Payer: Self-pay | Admitting: *Deleted

## 2010-09-04 LAB — CONVERTED CEMR LAB
Hemoglobin: 13.3 g/dL
Platelets: 240 10*3/uL

## 2010-09-12 ENCOUNTER — Ambulatory Visit: Payer: Self-pay | Admitting: Internal Medicine

## 2010-10-10 ENCOUNTER — Encounter: Payer: Self-pay | Admitting: Internal Medicine

## 2010-10-16 ENCOUNTER — Telehealth (INDEPENDENT_AMBULATORY_CARE_PROVIDER_SITE_OTHER): Payer: Self-pay | Admitting: *Deleted

## 2010-10-16 ENCOUNTER — Ambulatory Visit: Payer: Self-pay | Admitting: Internal Medicine

## 2010-10-18 ENCOUNTER — Encounter: Payer: Self-pay | Admitting: Internal Medicine

## 2010-10-19 ENCOUNTER — Encounter (INDEPENDENT_AMBULATORY_CARE_PROVIDER_SITE_OTHER): Payer: Self-pay | Admitting: *Deleted

## 2010-10-27 ENCOUNTER — Encounter (INDEPENDENT_AMBULATORY_CARE_PROVIDER_SITE_OTHER): Payer: Self-pay | Admitting: *Deleted

## 2010-10-27 ENCOUNTER — Encounter: Payer: Self-pay | Admitting: Internal Medicine

## 2010-10-31 ENCOUNTER — Encounter: Payer: Self-pay | Admitting: Internal Medicine

## 2010-11-02 ENCOUNTER — Encounter: Payer: Self-pay | Admitting: Internal Medicine

## 2010-11-07 ENCOUNTER — Encounter: Payer: Self-pay | Admitting: Internal Medicine

## 2010-11-14 ENCOUNTER — Encounter: Payer: Self-pay | Admitting: Internal Medicine

## 2010-11-30 ENCOUNTER — Encounter: Payer: Self-pay | Admitting: Internal Medicine

## 2010-12-01 ENCOUNTER — Encounter: Payer: Self-pay | Admitting: Internal Medicine

## 2010-12-10 ENCOUNTER — Encounter: Payer: Self-pay | Admitting: Internal Medicine

## 2010-12-17 LAB — CONVERTED CEMR LAB
BUN: 11 mg/dL (ref 6–23)
Chloride: 103 meq/L (ref 96–112)
Cholesterol: 208 mg/dL — ABNORMAL HIGH (ref 0–200)
Creatinine, Ser: 0.7 mg/dL (ref 0.4–1.2)
Glucose, Bld: 111 mg/dL — ABNORMAL HIGH (ref 70–99)
Potassium: 4 meq/L (ref 3.5–5.1)
Total CHOL/HDL Ratio: 8
Triglycerides: 191 mg/dL — ABNORMAL HIGH (ref 0.0–149.0)
VLDL: 38.2 mg/dL (ref 0.0–40.0)

## 2010-12-19 NOTE — Letter (Signed)
Summary: Primary Care Consult Scheduled Letter  [facility] [place]  [address] [place], [place] [address]   Phone: [phone]  Fax: [phone]      01/30/2010 MRN: [id number]  Meaghan Weinhold [facility] [place], [place]  [address]    Dear Ms. Rebel,    We have scheduled an appointment for you.  At the recommendation of Dr. Willow Ora, we have scheduled you a consult with Dr. Nita Sells of [facility] on 02-07-2010 at 11:00am.  Their address is [address], [place] [address]. The office phone number is [phone].  If this appointment day and time is not convenient for you, please feel free to call the office of the doctor you are being referred to at the number listed above and reschedule the appointment.    It is important for you to keep your scheduled appointments. We are here to make sure you are given good patient care.   Thank you,    Renee, Patient Care Coordinator [facility]

## 2010-12-19 NOTE — Progress Notes (Signed)
Summary: ???  Phone Note From Other Clinic Call back at [facility]; fax [phone]   Caller: Lady Deutscher PharmD Summary of Call:  - Ms. Oldenburg had an order back in sept for Senna plus once daily, this order was written as as needed.  She has recently been started on Colace 100mg  two times a day.  Would you like for Senna order to be continued or d/c'd?    - Her Aspirin order on your consult you wrote EC aspirin 81mg  once daily, but the orders written just stated aspirin 81mg  once daily.  She has ben getting chewable aspirin.  Please indicate below which you would like her to have EC ASA 81mg  once daily or chewable ASA 81mg  once daily.    - Are you checking her digoxin level & other labs?   Initial call taken by: Shary Decamp,  November 23, 2009 9:33 AM  Follow-up for Phone Call        d/c Senna plus needs ROV for labs and check up [facility] ASA 81 mg 1 by mouth once daily  Follow-up by: Tripton Ned E. Kimara Bencomo MD,  November 23, 2009 1:52 PM  Additional Follow-up for Phone Call Additional follow up Details #1::        Information sent Additional Follow-up by: Shary Decamp,  November 23, 2009 1:56 PM

## 2010-12-19 NOTE — Progress Notes (Signed)
Summary: fax from [facility], pt fell  Phone Note From Other Clinic   Caller: fax from [facility] [phone] Summary of Call: FAX FROM [facility] "resident was using walker when she was trying to get her pajamas & lost her balance.  Fell on her buttock, small scratch on left thigh under buttock.  BP 138/76, p 68, r 20, T 98.0" Shary Decamp  January 20, 2010 11:12 AM   Follow-up for Phone Call        let me know if she has moderate to severe pain in her hips or if  she is unable to ambulate Follow-up by: Jose E. Paz MD,  January 20, 2010 11:46 AM  Additional Follow-up for Phone Call Additional follow up Details #1::        faxed Additional Follow-up by: Shary Decamp,  January 20, 2010 11:46 AM

## 2010-12-19 NOTE — Consult Note (Signed)
Summary: Duard Larsen MD Dermatology  Duard Larsen MD Dermatology   Imported By: Lanelle Bal 04/04/2010 12:38:29  _____________________________________________________________________  External Attachment:    Type:   Image     Comment:   External Document

## 2010-12-19 NOTE — Miscellaneous (Signed)
Summary: Diet Orders/[facility]  Diet Orders/[facility]   Imported By: Lanelle Bal 12/15/2009 15:29:12  _____________________________________________________________________  External Attachment:    Type:   Image     Comment:   External Document

## 2010-12-19 NOTE — Progress Notes (Signed)
Summary: MRI  Phone Note Call from Patient   Caller: Daughter Summary of Call: Patient's daughter called and said they set up the appt for her MRI and they said she needs something ordered to relax her 1 hour before the scan. We need to fax the order to [facility]. Also we need to call Dr. Burman Nieves when we get the results from the MRI per the daughter. Initial call taken by: Harold Barban,  June 23, 2010 9:49 AM  Follow-up for Phone Call        Xanax 0.50 mg one or 2 before her MRI. Watch for drowsiness Follow-up by: Nolon Rod. Paz MD,  June 23, 2010 3:31 PM  Additional Follow-up for Phone Call Additional follow up Details #1::        faxed to [facility] at [phone]. Army Fossa CMA  June 23, 2010 3:39 PM     New/Updated Medications: XANAX 0.5 MG TABS (ALPRAZOLAM) 1 or 2 before her MRI. Watch for drowsiness Prescriptions: XANAX 0.5 MG TABS (ALPRAZOLAM) 1 or 2 before her MRI. Watch for drowsiness  #10 x 0   Entered by:   Army Fossa CMA   Authorized by:   Nolon Rod. Paz MD   Signed by:   Army Fossa CMA on 06/23/2010   Method used:   Print then Give to Patient   RxID:   [id number]

## 2010-12-19 NOTE — Progress Notes (Signed)
Summary: Refill Request  Phone Note Refill Request Call back at [phone] Message from:  Pharmacy on October 16, 2010 12:57 PM  Refills Requested: Medication #1:  MACRODANTIN 50 MG CAPS one by mouth daily   Dosage confirmed as above?Dosage Confirmed   Supply Requested: 1 month   Last Refilled: 06/20/2010 [facility] [address] in [place]  Next Appointment Scheduled: was seen today Initial call taken by: Harold Barban,  October 16, 2010 12:59 PM  Follow-up for Phone Call        pt needs to sent to [facility]. Army Fossa CMA  October 16, 2010 1:24 PM     Prescriptions: MACRODANTIN 50 MG CAPS (NITROFURANTOIN MACROCRYSTAL) one by mouth daily  #90 x 1   Entered by:   Army Fossa CMA   Authorized by:   Nolon Rod. Paz MD   Signed by:   Army Fossa CMA on 10/16/2010   Method used:   Printed then faxed to ...       [facility]  [address] [phone][facility] (retail[facility]       [address]       [place], [place]  [id number]       Ph: [phone] or [phone]       Fax: [phone]   RxID:   [id number]  FAXED TO [facility].

## 2010-12-19 NOTE — Assessment & Plan Note (Signed)
Summary: F/U FROM FALL/CDJ   Vital Signs:  Patient profile:   77 year old female Weight:      147 pounds Temp:     97.4 degrees F oral Pulse rate:   76 / minute Pulse rhythm:   regular BP sitting:   126 / 84  (left arm) Cuff size:   regular  Vitals Entered By: Army Fossa CMA (August 17, 2010 9:58 AM) CC: f/u on fall Comments Fell on Friday, wrist swollen and brusied Having head and chest congestion.    History of Present Illness: here with her daughter for evaluation after a fall The patient was trying to help her 49 year old roommate when she tripped and fell herself. No syncope, no loss of consciousness She went to the ER w/ a L chest and left wrist concussion ER records reviewed from 9-23, no acute findings on the left ribs, hip and wrist x-rays  has developed cough but no fever in the last few days, a number of residents in her assisted living facility with the same symptoms  ROS She had 3 or 4 mechanical falls last year, no syncope At the present time she is still hurting in the left chest, her wrist is still sore but the swelling has decreased significantly The patient's daughter reports that although she takes a number of medications that could make her sleepy she is not sleepy and that has not been the cause of the falls. She's not taking hydroxyzine and consequently this medication was removed from the med list  She uses her walker most of the time but sometimes she forgets She already had physical therapy in the past.   Current Medications (verified): 1)  Coreg 6.25 Mg  Tabs (Carvedilol) .Marland Kitchen.. 1(1/2 ) By Mouth Two Times A Day 2)  Cozaar 50 Mg Tabs (Losartan Potassium) .... Take 1 Tablet By Mouth Once A Day 3)  Furosemide 40 Mg Tabs (Furosemide) .... Once Daily 4)  Nitro-Dur 0.3 Mg/hr Pt24 (Nitroglycerin) .... Apply 1 Patch Every 12 Hours Remove @ Night 5)  Nexium 40 Mg Cpdr (Esomeprazole Magnesium) .... Take 1 Capsule By Mouth Once A Day 6)  Estrace 0.1  Mg/gm Crea (Estradiol) .... Insert 1 Gram Into Vagina Once Every Monday,wed,and Friday 7)  Fosamax 70 Mg Tabs (Alendronate Sodium) .Marland Kitchen.. 1 Tablet By Mouth Once A Week 8)  Zolpidem Tartrate 5 Mg  Tabs (Zolpidem Tartrate) .Marland Kitchen.. 1 By Mouth At Bedtime As Needed. 9)  Amitriptyline Hcl 25 Mg  Tabs (Amitriptyline Hcl) .Marland Kitchen.. 1 By Mouth Qhs 10)  Atrovent Hfa 17 Mcg/act  Aers (Ipratropium Bromide Hfa) .... 2 Puffs Four Times A Day As Needed 11)  Calcium + D 600 .... Two Times A Day 12)  Nitroglycerin 0.4 Mg Subl (Nitroglycerin) .Marland Kitchen.. 1 Tab Sl Every 5 Min As Needed Chest Pain 13)  Tylenol Arthritis Pain 650 Mg Cr-Tabs (Acetaminophen) .... One Tablet Every Morning, Then One Tablet Every 6 Hours As Needed 14)  Hydroxyzine Hcl 25 Mg Tabs (Hydroxyzine Hcl) .... Two Times A Day As Needed Itching 15)  Digoxin 0.125 Mg Tabs (Digoxin) .Marland Kitchen.. 1 By Mouth Once Daily 16)  Flomax 0.4 Mg Xr24h-Cap (Tamsulosin Hcl) .... Once Daily 17)  Lorazepam 0.5 Mg Tabs (Lorazepam) .... One Tablet By Mouth Twice A Day As Needed For Anxiety, Agitation or Insomnia 18)  Zocor 20 Mg Tabs (Simvastatin) .Marland Kitchen.. 1 Daily 19)  Aspirin 81 Mg Tbec (Aspirin) .... Take One Tablet By Mouth Daily 20)  Colace 100 Mg Caps (Docusate Sodium) .Marland KitchenMarland KitchenMarland Kitchen  One Cap By Mouth Bid 21)  Miralax  Powd (Polyethylene Glycol 3350) .Marland KitchenMarland KitchenMarland Kitchen 17 G Daily With Fluids 22)  Hydrocodone-Acetaminophen 5-325 Mg Tabs (Hydrocodone-Acetaminophen) .Marland Kitchen.. 1 By Mouth Bid 23)  Promethazine Hcl 12.5 Mg Tabs (Promethazine Hcl) .... 1/2 Tab By Mouth Two Times A Day 24)  Acetaminophen 650 Mg Cr-Tabs (Acetaminophen) .... Q6h As Needed For Pain/fever/ha 25)  Macrodantin 50 Mg Caps (Nitrofurantoin Macrocrystal) .... One By Mouth Daily 26)  Xanax 0.5 Mg Tabs (Alprazolam) .Marland Kitchen.. 1 or 2 Before Her Mri. Watch For Drowsiness  Allergies: 1)  ! Imdur  Past History:  Past Medical History: Reviewed history from 01/27/2010 and no changes required. Ischemic cardiomyopathy.  This was presumed based on EKG and  echocardiogram.  Her EKG shows evidence for an old anterior MI. Most recent echo in 1/11 with EF 30%, apical aneurysm, mild LVH, grade I diastolic dysfunction, mild MR, no AS.   Mild aspiration per swallow test 1-11 Pulmonary fibrosis--COPD  Patient was on home O2 at one point at night   There is a question of whether her pulmonary fibrosis was triggered as an adverse reaction to Macrodantin. chronic UTIs, Sepsis- UTI w/ MS changes 10-09 Status post left hip fracture with pinning in September 2009. History of falls. Possible TIAs. GERD Macular degeneration. Hypercholesterolemia.  DEMENTIA, SENILE, UNCOMPLICATED   Osteoarthritis, chronic neck pain   Past Surgical History: Reviewed history from 08/12/2009 and no changes required. Cholecystectomy Hysterectomy Oophorectomy Spinal fusion,x 3 ,  lower spine hip fracture 07-2008  Social History: Reviewed history from 12/19/2009 and no changes required. The patient is a resident of  [facility].  widow Daughter Dione Booze lives in [place], smoked for 30 years but quit over 30 years  ago.  Nondrinker.   Has no history of drug abuse.      Physical Exam  General:  alert.   Head:  face symmetric, she has ecchymoses in the left side Lungs:  normal respiratory effort and no intercostal retractions.   decreased breath sounds, very fine crackles at bases bilaterally but otherwise clear Heart:  s1/s2, + systolic murmur  Extremities:  no lower extremity edema Right wrist normal Left wrist slightly swollen in the dorsal aspect, range of motion decrease, pain with active motion   Impression & Recommendations:  Problem # 1:  GAIT DISTURBANCE (ICD-781.2) history of mechanical falls recently, no syncope. The patient had physical therapy before, uses her walker inconsistently is the daughter's observation that she is not falling because feels sleepy consequently we won't change her medication except remove  Hydroxazine from her list because she is not taking it ;furthermore, she tolerates hydrocodone well, they requested to increase the dose to t.i.d.  the pt's room is not cluttered  Reconsult physical therapy? daughter declined, "she is actually getting stronger" Encourage the patient to use her walker 100% of the time  Problem # 2:  COUGH (ICD-786.2) just started with a cold, start Robitussin, see instructions  Problem # 3:  OSTEOARTHRITIS (ICD-715.90) see number one, increase hydrocodone to  t.i.d. Her updated medication list for this problem includes:    Tylenol Arthritis Pain 650 Mg Cr-tabs (Acetaminophen) ..... One tablet every morning, then one tablet every 6 hours as needed    Aspirin 81 Mg Tbec (Aspirin) .Marland Kitchen... Take one tablet by mouth daily    Hydrocodone-acetaminophen 5-325 Mg Tabs (Hydrocodone-acetaminophen) .Marland Kitchen... 1 by mouth three times a day    Acetaminophen 650 Mg Cr-tabs (Acetaminophen) ..... Q6h as needed for  pain/fever/ha  Problem # 4:  other  issues -has a wrist contusion, daughter will call if not better in 2 to 3 weeks. Re  x-ray? -F2F > 25 min, > 50 % of the time counseling about fall prevention and discussing the issue w/ the patient's daughter   Complete Medication List: 1)  Coreg 6.25 Mg Tabs (Carvedilol) .Marland Kitchen.. 1(1/2 ) by mouth two times a day 2)  Cozaar 50 Mg Tabs (Losartan potassium) .... Take 1 tablet by mouth once a day 3)  Furosemide 40 Mg Tabs (Furosemide) .... Once daily 4)  Nitro-dur 0.3 Mg/hr Pt24 (Nitroglycerin) .... Apply 1 patch every 12 hours remove @ night 5)  Nexium 40 Mg Cpdr (Esomeprazole magnesium) .... Take 1 capsule by mouth once a day 6)  Estrace 0.1 Mg/gm Crea (Estradiol) .... Insert 1 gram into vagina once every monday,wed,and friday 7)  Fosamax 70 Mg Tabs (Alendronate sodium) .Marland Kitchen.. 1 tablet by mouth once a week 8)  Zolpidem Tartrate 5 Mg Tabs (Zolpidem tartrate) .Marland Kitchen.. 1 by mouth at bedtime as needed. 9)  Amitriptyline Hcl 25 Mg Tabs (Amitriptyline  hcl) .Marland Kitchen.. 1 by mouth qhs 10)  Atrovent Hfa 17 Mcg/act Aers (Ipratropium bromide hfa) .... 2 puffs four times a day as needed 11)  Calcium + D 600  .... Two times a day 12)  Nitroglycerin 0.4 Mg Subl (Nitroglycerin) .Marland Kitchen.. 1 tab sl every 5 min as needed chest pain 13)  Tylenol Arthritis Pain 650 Mg Cr-tabs (Acetaminophen) .... One tablet every morning, then one tablet every 6 hours as needed 14)  Digoxin 0.125 Mg Tabs (Digoxin) .Marland Kitchen.. 1 by mouth once daily 15)  Flomax 0.4 Mg Xr24h-cap (Tamsulosin hcl) .... Once daily 16)  Lorazepam 0.5 Mg Tabs (Lorazepam) .... One tablet by mouth twice a day as needed for anxiety, agitation or insomnia 17)  Zocor 20 Mg Tabs (Simvastatin) .Marland Kitchen.. 1 daily 18)  Aspirin 81 Mg Tbec (Aspirin) .... Take one tablet by mouth daily 19)  Colace 100 Mg Caps (Docusate sodium) .... One cap by mouth bid 20)  Miralax Powd (Polyethylene glycol 3350) .Marland KitchenMarland KitchenMarland Kitchen 17 g daily with fluids 21)  Hydrocodone-acetaminophen 5-325 Mg Tabs (Hydrocodone-acetaminophen) .Marland Kitchen.. 1 by mouth three times a day 22)  Promethazine Hcl 12.5 Mg Tabs (Promethazine hcl) .... 1/2 tab by mouth two times a day 23)  Acetaminophen 650 Mg Cr-tabs (Acetaminophen) .... Q6h as needed for pain/fever/ha 24)  Macrodantin 50 Mg Caps (Nitrofurantoin macrocrystal) .... One by mouth daily 25)  Xanax 0.5 Mg Tabs (Alprazolam) .Marland Kitchen.. 1 or 2 before her mri. watch for drowsiness  Patient Instructions: 1)  stop hidroxyzine 2)  increase hydrocodone to three times a day  3)  robitussin DM 1 or 2 tsp every 8 hours  as needed for cough 4)  call if symptoms worsen, fever

## 2010-12-19 NOTE — Miscellaneous (Signed)
Summary: PT & OT Eval Order/[facility]  PT & OT Eval Order/[facility]   Imported By: Lanelle Bal 09/13/2010 10:29:05  _____________________________________________________________________  External Attachment:    Type:   Image     Comment:   External Document

## 2010-12-19 NOTE — Miscellaneous (Signed)
Summary: FL2/[facility]  FL2/[facility]   Imported By: Lanelle Bal 05/05/2010 13:56:19  _____________________________________________________________________  External Attachment:    Type:   Image     Comment:   External Document

## 2010-12-19 NOTE — Progress Notes (Signed)
  Phone Note Outgoing Call   Summary of Call: UA showed 11-20 WBCs, many bact UCX pending: start cipro 250 1 by mouth two times a day x 7  Caedin Mogan E. Massai Hankerson MD  November 25, 2009 4:51 PM   Follow-up for Phone Call        order faxed to [facility] Follow-up by: Shary Decamp,  November 25, 2009 5:03 PM    New/Updated Medications: CIPRO 250 MG TABS (CIPROFLOXACIN HCL) two times a day x 7 days Prescriptions: CIPRO 250 MG TABS (CIPROFLOXACIN HCL) two times a day x 7 days  #14 x 0   Entered by:   Shary Decamp   Authorized by:   Nolon Rod. Alaija Ruble MD   Signed by:   Shary Decamp on 11/25/2009   Method used:   Printed then [facility] ...       [facility]  [address] [phone]* (retail)       [address]       [place], [place]  [id number]       Ph: [phone] or [phone]       Fax: [phone]   RxID:   [id number]

## 2010-12-19 NOTE — Miscellaneous (Signed)
Summary: Notice of Patient Fall/[facility]  Notice of Patient Fall/[facility]   Imported By: Lanelle Bal 05/19/2010 14:47:20  _____________________________________________________________________  External Attachment:    Type:   Image     Comment:   External Document

## 2010-12-19 NOTE — Miscellaneous (Signed)
[facility]: [facility]   Imported By: Kassie Mends 01/06/2010 08:34:44  _____________________________________________________________________  External Attachment:    Type:   Image     Comment:   External Document

## 2010-12-19 NOTE — Miscellaneous (Signed)
Summary: Episode Summary/[facility]  Episode Summary/[facility]   Imported By: Lanelle Bal 02/02/2010 10:53:14  _____________________________________________________________________  External Attachment:    Type:   Image     Comment:   External Document

## 2010-12-19 NOTE — Progress Notes (Signed)
Summary: Refill Request  Phone Note Refill Request Call back at [phone] Message from:  Pharmacy on April 20, 2010 11:07 AM  Refills Requested: Medication #1:  HYDROCODONE-ACETAMINOPHEN 5-325 MG TABS 1 by mouth bid.   Dosage confirmed as above?Dosage Confirmed   Supply Requested: 1 month [facility] [address] [place], [place] [address]  Next Appointment Scheduled: none Initial call taken by: Harold Barban,  April 20, 2010 11:07 AM  Follow-up for Phone Call        spoke with pharmacy rx not received rx verbally given to pharmacy per phone note 04-13-10, #50 no refills.Marland KitchenMarland KitchenFelecia Deloach CMA  April 20, 2010 3:50 PM

## 2010-12-19 NOTE — Miscellaneous (Signed)
Summary: clarify med list. 1/26  Received HH cert & poc from [facility].  Reviewed med list.  Coreg 6.25mg  1 1/2 two times a day & lorazepam 0.5mg  as needed are not on their med list.  Faxed plan of care back to [facility] with note about meds.  Have they been d/c'd? Shary Decamp  December 14, 2009 5:21 PM was seen by cards recently, the med list includes coreg thus she needs to be on it ok to d/c lorazepam, we can restart it lter if needed Stites E. Paz MD  December 17, 2009 10:55 AM   Clinical Lists Changes  Medications: Removed medication of PROMETHAZINE HCL 12.5 MG TABS (PROMETHAZINE HCL) 1/2-1 by mouth bid Removed medication of HYDROCODONE-ACETAMINOPHEN 5-325 MG TABS (HYDROCODONE-ACETAMINOPHEN) 1 by mouth two times a day

## 2010-12-19 NOTE — Progress Notes (Signed)
Summary: ABX needed  Phone Note Other Incoming   Caller: April from [facility] Summary of Call: April called to let us know that they had gotten orders to do a UA, CS on [name] last week and it came back positive for an UTI. They have not recieved any orders for an abx to treat this. Please fax orders back.  Initial call taken by: Harold Barban,  May 24, 2010 2:12 PM  Follow-up for Phone Call        they need to fax me ASAP the results of the urine culture Follow-up by: [facility] E. Sky Primo MD,  May 24, 2010 2:56 PM  Additional Follow-up for Phone Call Additional follow up Details #1::        papers on ledge for futher review. Additional Follow-up by: Harold Barban,  May 24, 2010 3:27 PM    Additional Follow-up for Phone Call Additional follow up Details #2::    urine culture showed enterococcus This is a preliminary report, no sensitivity Plan:  macrobid  50 mg one p.o. b.i.d. for one week ask them to fax [place] the sensitivity [name] E. Lilou Kneip MD  May 24, 2010 3:46 PM     Additional Follow-up for Phone Call Additional follow up Details #3:: Details for Additional Follow-up Action Taken: Sensitivity is on the way and rx was faxed.   Sensitivity on your ledge for review. Army Fossa CMA  May 25, 2010 4:15 PM  enterococcus is sensitive to nitrofurantoin.   please call Macrobid as prescribed [name] E. Jessamine Barcia MD  May 26, 2010 9:42 AM   Spoke to April at [facility] [phone], they received order on Tuesday and pt has been taking Macrodantin since then.  [name] Fergerson CMA (AAMA)  May 26, 2010 11:11 AM  Additional Follow-up by:    New/Updated Medications: ZOLPIDEM TARTRATE 5 MG  TABS (ZOLPIDEM TARTRATE) 1 by mouth at bedtime as needed. MACRODANTIN 50 MG CAPS (NITROFURANTOIN MACROCRYSTAL) one p.o. b.i.d. for one week ACETAMINOPHEN 650 MG CR-TABS (ACETAMINOPHEN) q6h as needed for pain/fever/HA Prescriptions: MACRODANTIN 50 MG CAPS (NITROFURANTOIN MACROCRYSTAL)  one p.o. b.i.d. for one week  #14 x 0   Entered by:   Army Fossa CMA   Authorized by:   Nolon Rod. Walt Geathers MD   Signed by:   Army Fossa CMA on 05/24/2010   Method used:   Print then Give to Patient   RxID:   [id number]

## 2010-12-19 NOTE — Assessment & Plan Note (Signed)
Summary: 4 month rov need labs prior to appt/sl      Allergies Added:   Primary Provider:  Drue Novel  CC:  4 month follow up. Pt has concerns over being "overly tired"  all the time.  She has been feeling down and just doesnt have any energy to do anything.  History of Present Illness: 77 yo with history of CAD, ischemic cardiomyopathy, and pulmonary fibrosis returns for evaluation.   Since I last saw her, the patient has been doing fairly well.  She is able to walk about 50 yards, then is short of breath and fatigued.  No orthopnea or PND.  No significant chest pain.  She walks with a walker at her assisted living.  Per her daughter, she has been more confused and forgetful recently.   Oxygen saturation on room air at rest = 91%. Oxygen saturation on room air with exertion = 87%  Labs (9/10): LDL 140, HDL 27, K 4, creatinine 0.7, BNP 226  Current Medications (verified): 1)  Hydrocodone-Acetaminophen 5-325 Mg Tabs (Hydrocodone-Acetaminophen) .Marland Kitchen.. 1 By Mouth Two Times A Day 2)  Promethazine Hcl 12.5 Mg Tabs (Promethazine Hcl) .... 1/2-1 By Mouth Bid 3)  Coreg 6.25 Mg  Tabs (Carvedilol) .Marland Kitchen.. 1(1/2 ) By Mouth Two Times A Day 4)  Cozaar 50 Mg Tabs (Losartan Potassium) .... Take 1 Tablet By Mouth Once A Day 5)  Furosemide 40 Mg Tabs (Furosemide) .... Once Daily 6)  Nitro-Dur 0.3 Mg/hr Pt24 (Nitroglycerin) .... Apply 1 Patch Every 12 Hours Remove @ Night 7)  Nexium 40 Mg Cpdr (Esomeprazole Magnesium) .... Take 1 Capsule By Mouth Once A Day 8)  Estrace 0.1 Mg/gm Crea (Estradiol) .... Insert 1 Gram Into Vagina Once Every Monday,wed,and Friday 9)  Fosamax 70 Mg Tabs (Alendronate Sodium) .Marland Kitchen.. 1 Tablet By Mouth Once A Week 10)  Zolpidem Tartrate 5 Mg  Tabs (Zolpidem Tartrate) .Marland Kitchen.. 1 By Mouth At Bedtime 11)  Amitriptyline Hcl 25 Mg  Tabs (Amitriptyline Hcl) .Marland Kitchen.. 1 By Mouth Qhs 12)  Atrovent Hfa 17 Mcg/act  Aers (Ipratropium Bromide Hfa) .... 2 Puffs Four Times A Day As Needed 13)  Calcium + D 600 ....  Two Times A Day 14)  Nitroglycerin 0.4 Mg Subl (Nitroglycerin) .Marland Kitchen.. 1 Tab Sl Every 5 Min As Needed Chest Pain 15)  Tylenol Arthritis Pain 650 Mg Cr-Tabs (Acetaminophen) .... One Every 4 Hours As Needed Pain 16)  Hydroxyzine Hcl 25 Mg Tabs (Hydroxyzine Hcl) .... Two Times A Day As Needed Itching 17)  Digoxin 0.125 Mg Tabs (Digoxin) .Marland Kitchen.. 1 By Mouth Once Daily 18)  Flomax 0.4 Mg Xr24h-Cap (Tamsulosin Hcl) .... Once Daily 19)  Lorazepam 0.5 Mg Tabs (Lorazepam) .... As Needed 20)  Zocor 20 Mg Tabs (Simvastatin) .Marland Kitchen.. 1 Daily 21)  Aspirin 81 Mg Tbec (Aspirin) .... Take One Tablet By Mouth Daily 22)  Colace 100 Mg Caps (Docusate Sodium) .... One Cap By Mouth Bid  Allergies (verified): 1)  ! Imdur  Past History:  Past Medical History: 1. Ischemic cardiomyopathy.  This was presumed based on EKG and echocardiogram.  Her EKG shows evidence for an old anterior MI. Most recent echo in 1/11 with EF 30%, apical aneurysm, mild LVH, grade I diastolic dysfunction, mild MR, no AS.  2. Pulmonary fibrosis.  Patient was on home O2 at one point at night but is o 3. Status post left hip fracture with pinning in September 2009. 4. History of falls. 5. Possible TIAs. 6. History of UTIs.  There is a  question of whether her pulmonary fibrosis was triggered as an adverse reaction to Macrodantin. 7. Gastroesophageal reflux disease. 8. Macular degeneration. 9. Hypercholesterolemia.  10. DEMENTIA, SENILE, UNCOMPLICATED (ICD-290.0)  Family History: Reviewed history from 08/12/2009 and no changes required. Family History of Cardiovascular disorder  Social History: Reviewed history from 11/07/2009 and no changes required. The patient is a resident of [facility] assisted living nursing facility.  Ex-smoker, smoked for 30 years but quit over 30 years  ago.  Nondrinker.  Has no history of drug abuse.      Review of Systems       All systems reviewed and negative except as per HPI.   Vital  Signs:  Patient profile:   77 year old female Height:      67 inches Weight:      146 pounds O2 Sat:      91 % on Room air Pulse rate:   66 / minute Pulse rhythm:   regular BP sitting:   126 / 78  (left arm) Cuff size:   regular  Vitals Entered By: Judithe Modest CMA (December 06, 2009 11:16 AM)  O2 Flow:  Room air  Physical Exam  General:  Frail elderly female.  Neck:  Neck supple, no JVD. No masses, thyromegaly or abnormal cervical nodes. Lungs:  Dry crackles 1/2 up lung fields bilaterally.  Heart:  Non-displaced PMI, chest non-tender; regular rate and rhythm, S1, S2 without rubs or gallops, 1/6 SEM RUSB. Carotid upstroke normal, no bruit.  Pedals normal pulses. No edema, no varicosities. Abdomen:  Bowel sounds positive; abdomen soft and non-tender without masses, organomegaly, or hernias noted. No hepatosplenomegaly. Extremities:  No clubbing or cyanosis. Neurologic:  Alert and oriented x 3. Psych:  Normal affect.   Impression & Recommendations:  Problem # 1:  CORONARY ARTERY DISEASE (ICD-414.00) No ischemic-type symptoms.  Continue ASA and statin, Coreg, ARB.   Check lipids/LFTs on statin.    Problem # 2:  CONGESTIVE HEART FAILURE (ICD-428.0) EF 30% on recent echo.  Patient does not appear significantly volume overloaded today.  I do not think that additional Lasix will help.  I think her shortness of breath may be primarily related to pulmonary fibrosis.  She has used home oxygen in the past but is not using it now.  Oxygen saturation decreases to 87% with exertion.  Patient qualifies for home oxygen with exertion, which we will set up for her.  I will check a BNP and digoxin level.    Patient Instructions: 1)  I will arrange oxygen for you to use to help with your breathing. 2)  Your physician wants you to follow-up in:  6 months with Dr Marca Ancona . You will receive a reminder letter in the mail two months in advance. If you don't receive a letter, please call our office  to schedule the follow-up appointment. 3)  Your physician recommends that you return for a FASTING lipid profile/liver/BMP/TSH/BNP 780.79 414.01   Vital Signs:  Patient profile:   77 year old female Height:      67 inches Weight:      146 pounds O2 Sat:      91 % Pulse rate:   66 / minute Pulse rhythm:   regular BP sitting:   126 / 78  (left arm) Cuff size:   regular  Vitals Entered By: Judithe Modest CMA (December 06, 2009 11:16 AM)  O2 Flow:  Room air   Serial Vital Signs/Assessments:  Comments: 91% room air  at rest 88% room air 1 minute walk 95% 2/L 1 minute walk By: Katina Dung, RN, BSN

## 2010-12-19 NOTE — Progress Notes (Signed)
Summary: REFILL  Phone Note Refill Request Message from:  Fax from Pharmacy on June 05, 2010 4:41 PM  Refills Requested: Medication #1:  AMITRIPTYLINE HCL 25 MG  TABS 1 by mouth qhs [place] - FAX [phone] - TEL [phone]  Initial call taken by: Okey Regal Spring,  June 05, 2010 4:42 PM  Follow-up for Phone Call         okay #30, 6 refills Follow-up by: [facility] E. Bobie Kistler MD,  June 07, 2010 10:16 AM    Prescriptions: AMITRIPTYLINE HCL 25 MG  TABS (AMITRIPTYLINE HCL) 1 by mouth qhs  #30 x 6   Entered by:   Army Fossa CMA   Authorized by:   Nolon Rod. Laelynn Blizzard MD   Signed by:   Army Fossa CMA on 06/07/2010   Method used:   Printed then faxed to ...       [facility]  [address] [phone]* (retail)       [address]       [place], [place]  [id number]       Ph: [phone] or [phone]       Fax: [phone]   RxID:   [id number]  [facility]. Army Fossa CMA  June 07, 2010 10:22 AM

## 2010-12-19 NOTE — Progress Notes (Signed)
Summary: amitriptyline refill   Phone Note Refill Request Message from:  Fax from Pharmacy on June 12, 2010 8:59 AM  Refills Requested: Medication #1:  AMITRIPTYLINE HCL 25 MG  TABS 1 by mouth qhs [place] [facility] - [address] [place] - fax [phone] Jani Files [phone]  Initial call taken by: Okey Regal Spring,  June 12, 2010 9:01 AM    Prescriptions: AMITRIPTYLINE HCL 25 MG  TABS (AMITRIPTYLINE HCL) 1 by mouth qhs  #30 x 6   Entered by:   Doristine Devoid CMA   Authorized by:   Nolon Rod. Paz MD   Signed by:   Doristine Devoid CMA on 06/12/2010   Method used:   Reprint   RxID:   [id number]

## 2010-12-19 NOTE — Progress Notes (Signed)
Summary: +u.cx  Phone Note From Other Clinic   Caller: fax from [facility] [phone] Summary of Call: URINE CULTURE RESULTS: >100, 000 colonies e-coli see report...Marland KitchenMarland KitchenMarland Kitchen Shary Decamp  December 27, 2009 4:32 PM  FAXED TO [facility] see order for Cipro 250 two times a day x 7 days, call if UTI sxs Shary Decamp  December 27, 2009 5:07 PM       New/Updated Medications: CIPRO 250 MG TABS (CIPROFLOXACIN HCL) two times a day x 7 days

## 2010-12-19 NOTE — Assessment & Plan Note (Signed)
Summary: rto 6 weeks/cbs   Vital Signs:  Patient profile:   77 year old female Weight:      138.13 pounds Temp:     98.5 degrees F oral Pulse rate:   70 / minute Pulse rhythm:   regular BP sitting:   126 / 84  (left arm) Cuff size:   regular  Vitals Entered By: Army Fossa CMA (October 16, 2010 10:23 AM) CC: 6 week f/u.  Comments c/o vomitting/diarrhea x 1 week -c/o being weak. print rx's.   History of Present Illness: routine followup a few days ago, a number of patients at her assisted living facility  developed nausea, vomiting and diarrhea She had fever up to yesterday The diarrhea has slowed down but she still feels very weak. Request a B12 shot.   review of systems As far as her gait, she had physical therapy, they focus on teach her how  to walk with her limited vision. She is using her walker more consistently. Doing great As far as the DJD, we  increased her  hydrocodone , she feels great Taking antibiotics daily for recurrent UTIs, so far she is asymptomatic  Current Medications (verified): 1)  Coreg 6.25 Mg  Tabs (Carvedilol) .Marland Kitchen.. 1(1/2 ) By Mouth Two Times A Day 2)  Cozaar 50 Mg Tabs (Losartan Potassium) .... Take 1 Tablet By Mouth Once A Day 3)  Furosemide 40 Mg Tabs (Furosemide) .... Once Daily 4)  Nitro-Dur 0.3 Mg/hr Pt24 (Nitroglycerin) .... Apply 1 Patch Every 12 Hours Remove @ Night 5)  Nexium 40 Mg Cpdr (Esomeprazole Magnesium) .... Take 1 Capsule By Mouth Once A Day 6)  Estrace 0.1 Mg/gm Crea (Estradiol) .... Insert 1 Gram Into Vagina Once Every Monday,wed,and Friday 7)  Fosamax 70 Mg Tabs (Alendronate Sodium) .Marland Kitchen.. 1 Tablet By Mouth Once A Week 8)  Zolpidem Tartrate 5 Mg  Tabs (Zolpidem Tartrate) .Marland Kitchen.. 1 By Mouth At Bedtime As Needed. 9)  Amitriptyline Hcl 25 Mg  Tabs (Amitriptyline Hcl) .Marland Kitchen.. 1 By Mouth Qhs 10)  Atrovent Hfa 17 Mcg/act  Aers (Ipratropium Bromide Hfa) .... 2 Puffs Four Times A Day As Needed 11)  Calcium + D 600 .... Two Times A  Day 12)  Nitroglycerin 0.4 Mg Subl (Nitroglycerin) .Marland Kitchen.. 1 Tab Sl Every 5 Min As Needed Chest Pain 13)  Tylenol Arthritis Pain 650 Mg Cr-Tabs (Acetaminophen) .... One Tablet Every Morning, Then One Tablet Every 6 Hours As Needed 14)  Digoxin 0.125 Mg Tabs (Digoxin) .Marland Kitchen.. 1 By Mouth Once Daily 15)  Flomax 0.4 Mg Xr24h-Cap (Tamsulosin Hcl) .... Once Daily 16)  Lorazepam 0.5 Mg Tabs (Lorazepam) .... One Tablet By Mouth Twice A Day As Needed For Anxiety, Agitation or Insomnia 17)  Zocor 20 Mg Tabs (Simvastatin) .Marland Kitchen.. 1 Daily 18)  Aspirin 81 Mg Tbec (Aspirin) .... Take One Tablet By Mouth Daily 19)  Colace 100 Mg Caps (Docusate Sodium) .... One Cap By Mouth Bid 20)  Miralax  Powd (Polyethylene Glycol 3350) .Marland KitchenMarland KitchenMarland Kitchen 17 G Daily With Fluids 21)  Hydrocodone-Acetaminophen 5-325 Mg Tabs (Hydrocodone-Acetaminophen) .Marland Kitchen.. 1 By Mouth Three Times A Day 22)  Promethazine Hcl 12.5 Mg Tabs (Promethazine Hcl) .... 1/2 Tab By Mouth Two Times A Day 23)  Acetaminophen 650 Mg Cr-Tabs (Acetaminophen) .... Q6h As Needed For Pain/fever/ha 24)  Macrodantin 50 Mg Caps (Nitrofurantoin Macrocrystal) .... One By Mouth Daily  Allergies (verified): 1)  ! Imdur  Past History:  Past Medical History: Ischemic cardiomyopathy.  This was presumed based on EKG and  echocardiogram.  Her EKG shows evidence for an old anterior MI. Most recent echo in 1/11 with EF 30%, apical aneurysm, mild LVH, grade I diastolic dysfunction, mild MR, no AS.   Mild aspiration per swallow test 1-11 Pulmonary fibrosis--COPD  Patient was on home O2 at one point at night   There is a question of whether her pulmonary fibrosis was triggered as an adverse reaction to Macrodantin. Chronic UTIs, Sepsis- UTI w/ MS changes 10-09 Status post left hip fracture with pinning in September 2009. History of falls. Possible TIAs. GERD Macular degeneration-- L blindness,  R: peripheral vision only Hypercholesterolemia.  DEMENTIA, SENILE, UNCOMPLICATED   Osteoarthritis,  chronic neck pain   Past Surgical History: Reviewed history from 08/12/2009 and no changes required. Cholecystectomy Hysterectomy Oophorectomy Spinal fusion,x 3 ,  lower spine hip fracture 07-2008  Social History: Reviewed history from 12/19/2009 and no changes required. The patient is a resident of  [facility] facility.  widow Daughter Dione Booze lives in [place], smoked for 30 years but quit over 30 years  ago.  Nondrinker.   Has no history of drug abuse.      Physical Exam  General:  alert and well-developed.   Lungs:  normal respiratory effort and no intercostal retractions.   decreased breath sounds, very fine crackles at bases bilaterally but otherwise clear Heart:  s1/s2, + systolic murmur  Abdomen:  soft, non-tender, and no distention.   Extremities:  no edema   Impression & Recommendations:  Problem # 1:  GAIT DISTURBANCE (ICD-781.2) improved after physical therapy. The focus was on teach her how to walk with her limited vision  Problem # 2:  MACULAR DEGENERATION (ICD-362.50) L blindness, R : peripheral vision only  Problem # 3:  URINARY TRACT INFECTION, RECURRENT (ICD-599.0) tolerating antibiotics well Her updated medication list for this problem includes:    Macrodantin 50 Mg Caps (Nitrofurantoin macrocrystal) ..... One by mouth daily  Problem # 4:  OSTEOARTHRITIS (ICD-715.90) tolerates hydrocodone 3 times a day   Her updated medication list for this problem includes:    Tylenol Arthritis Pain 650 Mg Cr-tabs (Acetaminophen) ..... One tablet every morning, then one tablet every 6 hours as needed    Aspirin 81 Mg Tbec (Aspirin) .Marland Kitchen... Take one tablet by mouth daily    Hydrocodone-acetaminophen 5-325 Mg Tabs (Hydrocodone-acetaminophen) .Marland Kitchen... 1 by mouth three times a day    Acetaminophen 650 Mg Cr-tabs (Acetaminophen) ..... Q6h as needed for pain/fever/ha  Problem # 5:  her program from a episode of nausea vomiting and diarrhea feels  weak Request a B12 shot --- done   Complete Medication List: 1)  Coreg 6.25 Mg Tabs (Carvedilol) .Marland Kitchen.. 1(1/2 ) by mouth two times a day 2)  Cozaar 50 Mg Tabs (Losartan potassium) .... Take 1 tablet by mouth once a day 3)  Furosemide 40 Mg Tabs (Furosemide) .... Once daily 4)  Nitro-dur 0.3 Mg/hr Pt24 (Nitroglycerin) .... Apply 1 patch every 12 hours remove @ night 5)  Nexium 40 Mg Cpdr (Esomeprazole magnesium) .... Take 1 capsule by mouth once a day 6)  Estrace 0.1 Mg/gm Crea (Estradiol) .... Insert 1 gram into vagina once every monday,wed,and friday 7)  Fosamax 70 Mg Tabs (Alendronate sodium) .Marland Kitchen.. 1 tablet by mouth once a week 8)  Zolpidem Tartrate 5 Mg Tabs (Zolpidem tartrate) .Marland Kitchen.. 1 by mouth at bedtime as needed. 9)  Amitriptyline Hcl 25 Mg Tabs (Amitriptyline hcl) .Marland Kitchen.. 1 by mouth qhs 10)  Atrovent Hfa 17  Mcg/act Aers (Ipratropium bromide hfa) .... 2 puffs four times a day as needed 11)  Calcium + D 600  .... Two times a day 12)  Nitroglycerin 0.4 Mg Subl (Nitroglycerin) .Marland Kitchen.. 1 tab sl every 5 min as needed chest pain 13)  Tylenol Arthritis Pain 650 Mg Cr-tabs (Acetaminophen) .... One tablet every morning, then one tablet every 6 hours as needed 14)  Digoxin 0.125 Mg Tabs (Digoxin) .Marland Kitchen.. 1 by mouth once daily 15)  Flomax 0.4 Mg Xr24h-cap (Tamsulosin hcl) .... Once daily 16)  Lorazepam 0.5 Mg Tabs (Lorazepam) .... One tablet by mouth twice a day as needed for anxiety, agitation or insomnia 17)  Zocor 20 Mg Tabs (Simvastatin) .Marland Kitchen.. 1 daily 18)  Aspirin 81 Mg Tbec (Aspirin) .... Take one tablet by mouth daily 19)  Colace 100 Mg Caps (Docusate sodium) .... One cap by mouth bid 20)  Miralax Powd (Polyethylene glycol 3350) .Marland KitchenMarland KitchenMarland Kitchen 17 g daily with fluids 21)  Hydrocodone-acetaminophen 5-325 Mg Tabs (Hydrocodone-acetaminophen) .Marland Kitchen.. 1 by mouth three times a day 22)  Promethazine Hcl 12.5 Mg Tabs (Promethazine hcl) .... 1/2 tab by mouth two times a day 23)  Acetaminophen 650 Mg Cr-tabs (Acetaminophen)  .... Q6h as needed for pain/fever/ha 24)  Macrodantin 50 Mg Caps (Nitrofurantoin macrocrystal) .... One by mouth daily  Other Orders: Admin of Therapeutic Inj  intramuscular or subcutaneous (16109) Vit B12 1000 mcg (U0454)  Patient Instructions: 1)  Please schedule a follow-up appointment in 3 months .    Medication Administration  Injection # 1:    Medication: Vit B12 1000 mcg    Diagnosis: OSTEOPOROSIS (ICD-733.00)    Route: IM    Site: L deltoid    Exp Date: 01/2012    Lot #: [id number]    Mfr: [employer]  Orders Added: 1)  Admin of Therapeutic Inj  intramuscular or subcutaneous [96372] 2)  Vit B12 1000 mcg [J3420] 3)  Est. Patient Level III [09811]   Immunization History:  Influenza Immunization History:    Influenza:  historical (09/11/2010)   Immunization History:  Influenza Immunization History:    Influenza:  Historical (09/11/2010)

## 2010-12-19 NOTE — Assessment & Plan Note (Signed)
Summary: DISCUSS RESULTS/ lipid profile/liver/BMP/TSH/BNP 780.79 414.01   Vital Signs:  Patient profile:   77 year old female Height:      67 inches Weight:      146.4 pounds Pulse rate:   86 / minute BP sitting:   118 / 60  Vitals Entered By: Shary Decamp (December 19, 2009 9:04 AM) CC: rov Comments  - discuss barium swallow results  - wants order to take tylenol qam  - c/o of nausea  - left ear pain  - constipation, using colace once daily Shary Decamp  December 19, 2009 9:17 AM    History of Present Illness:  her only complained today he is general fatigue barium swallow results--  reviewed wants order to take tylenol qam--done  c/o of nausea, no worse with food, no abdominal   pain left ear pain constipation, using colace once day      Allergies: 1)  ! Imdur  Past History:  Past Medical History: Ischemic cardiomyopathy.  This was presumed based on EKG and echocardiogram.  Her EKG shows evidence for an old anterior MI. Most recent echo in 1/11 with EF 30%, apical aneurysm, mild LVH, grade I diastolic dysfunction, mild MR, no AS.   Mild aspiration per swallow test 1-11 Pulmonary fibrosis--COPD  Patient was on home O2 at one point at night   There is a question of whether her pulmonary fibrosis was triggered as an adverse reaction to Macrodantin. chronic UTIs, Sepsis- UTI w/ MS changes 10-09 Status post left hip fracture with pinning in September 2009. History of falls. Possible TIAs. GERD Macular degeneration. Hypercholesterolemia.  DEMENTIA, SENILE, UNCOMPLICATED (ICD-290.0) Osteoarthritis, chronic neck pain   Past Surgical History: Reviewed history from 08/12/2009 and no changes required. Cholecystectomy Hysterectomy Oophorectomy Spinal fusion,x 3 ,  lower spine hip fracture 07-2008  Social History: The patient is a resident of  [facility] nursing facility.  widow Daughter Dione Booze lives in [place], smoked for 30 years but quit  over 30 years  ago.  Nondrinker.   Has no history of drug abuse.      Review of Systems       denies chest pain shortness of breath is at baseline, states that her oxygen helps quite a bit denies lower extremity edema no fevers or cough extensive paperwork from [facility]  reviewed she had already a flu shot according with the daughter  Physical Exam  General:  alert and well-developed.  no distress at rest, has her O2 with her, uses a walker Ears:  R ear normal and L ear normal.   Lungs:  normal respiratory effort and no intercostal retractions.   decreased breath sounds, very fine crackles at bases bilaterally Heart:  s1/s2, + systolic murmur  Abdomen:  soft and non-tender.   Extremities:  no edema Neurologic:  not oriented in time but knows her name and place very pleasent  demeanor,   Impression & Recommendations:  Problem # 1:  CONGESTIVE HEART FAILURE (ICD-428.0) stable, labs as ordered by cardiology Her updated medication list for this problem includes:    Coreg 6.25 Mg Tabs (Carvedilol) .Marland Kitchen... 1(1/2 ) by mouth two times a day    Cozaar 50 Mg Tabs (Losartan potassium) .Marland Kitchen... Take 1 tablet by mouth once a day    Furosemide 40 Mg Tabs (Furosemide) ..... Once daily    Digoxin 0.125 Mg Tabs (Digoxin) .Marland Kitchen... 1 by mouth once daily    Aspirin 81 Mg Tbec (Aspirin) .Marland Kitchen... Take one tablet  by mouth daily  Problem # 2:  CORONARY ARTERY DISEASE (ICD-414.00) seems stable, labs as ordered by cardiology Her updated medication list for this problem includes:    Coreg 6.25 Mg Tabs (Carvedilol) .Marland Kitchen... 1(1/2 ) by mouth two times a day    Cozaar 50 Mg Tabs (Losartan potassium) .Marland Kitchen... Take 1 tablet by mouth once a day    Furosemide 40 Mg Tabs (Furosemide) ..... Once daily    Nitro-dur 0.3 Mg/hr Pt24 (Nitroglycerin) .Marland Kitchen... Apply 1 patch every 12 hours remove @ night    Nitroglycerin 0.4 Mg Subl (Nitroglycerin) .Marland Kitchen... 1 tab sl every 5 min as needed chest pain    Aspirin 81 Mg Tbec (Aspirin) .Marland Kitchen...  Take one tablet by mouth daily  Orders: Venipuncture (16109) TLB-Lipid Panel (80061-LIPID) TLB-Hepatic/Liver Function Pnl (80076-HEPATIC) TLB-BMP (Basic Metabolic Panel-BMET) (80048-METABOL) TLB-TSH (Thyroid Stimulating Hormone) (84443-TSH) TLB-BNP (B-Natriuretic Peptide) (83880-BNPR)  Problem # 3:  DEMENTIA, SENILE, UNCOMPLICATED (ICD-290.0) currently living at the assisted living facility, she seems stable at this point will keep her on lorazepam  as needed  Problem # 4:  PULMONARY FIBROSIS (ICD-515) pulmonary fibrosis and  COPD stable continue with oxygen supplementation had flu shot already  Problem # 5:  CONSTIPATION (ICD-564.00) add MiraLax Her updated medication list for this problem includes:    Colace 100 Mg Caps (Docusate sodium) ..... One cap by mouth bid    Miralax Powd (Polyethylene glycol 3350) .Marland KitchenMarland KitchenMarland KitchenMarland Kitchen 17 g daily with fluids  Problem # 6:  DYSPHAGIA UNSPECIFIED (ICD-787.20) + penetration and mild aspiration with thin liquids  she has been already ordered mech soft and chopped meats  Problem # 7:  other issues c/o of nausea, left ear pain see instructions   Problem # 8:  time spent > 25 min reviewing her chart and clarifying orders   Complete Medication List: 1)  Hydrocodone-acetaminophen 5-325 Mg Tabs (Hydrocodone-acetaminophen) .Marland Kitchen.. 1 by mouth two times a day 2)  Promethazine Hcl 12.5 Mg Tabs (Promethazine hcl) .... 1/2-1 by mouth bid 3)  Coreg 6.25 Mg Tabs (Carvedilol) .Marland Kitchen.. 1(1/2 ) by mouth two times a day 4)  Cozaar 50 Mg Tabs (Losartan potassium) .... Take 1 tablet by mouth once a day 5)  Furosemide 40 Mg Tabs (Furosemide) .... Once daily 6)  Nitro-dur 0.3 Mg/hr Pt24 (Nitroglycerin) .... Apply 1 patch every 12 hours remove @ night 7)  Nexium 40 Mg Cpdr (Esomeprazole magnesium) .... Take 1 capsule by mouth once a day 8)  Estrace 0.1 Mg/gm Crea (Estradiol) .... Insert 1 gram into vagina once every monday,wed,and friday 9)  Fosamax 70 Mg Tabs (Alendronate  sodium) .Marland Kitchen.. 1 tablet by mouth once a week 10)  Zolpidem Tartrate 5 Mg Tabs (Zolpidem tartrate) .Marland Kitchen.. 1 by mouth at bedtime 11)  Amitriptyline Hcl 25 Mg Tabs (Amitriptyline hcl) .Marland Kitchen.. 1 by mouth qhs 12)  Atrovent Hfa 17 Mcg/act Aers (Ipratropium bromide hfa) .... 2 puffs four times a day as needed 13)  Calcium + D 600  .... Two times a day 14)  Nitroglycerin 0.4 Mg Subl (Nitroglycerin) .Marland Kitchen.. 1 tab sl every 5 min as needed chest pain 15)  Tylenol Arthritis Pain 650 Mg Cr-tabs (Acetaminophen) .... One tablet every morning, then one tablet every 6 hours as needed 16)  Hydroxyzine Hcl 25 Mg Tabs (Hydroxyzine hcl) .... Two times a day as needed itching 17)  Digoxin 0.125 Mg Tabs (Digoxin) .Marland Kitchen.. 1 by mouth once daily 18)  Flomax 0.4 Mg Xr24h-cap (Tamsulosin hcl) .... Once daily 19)  Lorazepam 0.5 Mg Tabs (  Lorazepam) .... One tablet by mouth twice a day as needed for anxiety, agitation or insomnia 20)  Zocor 20 Mg Tabs (Simvastatin) .Marland Kitchen.. 1 daily 21)  Aspirin 81 Mg Tbec (Aspirin) .... Take one tablet by mouth daily 22)  Colace 100 Mg Caps (Docusate sodium) .... One cap by mouth bid 23)  Miralax Powd (Polyethylene glycol 3350) .Marland KitchenMarland KitchenMarland Kitchen 17 g daily with fluids  Other Orders: Specimen Handling (60454) UA Dipstick w/o Micro (manual) (81002)  Patient Instructions: 1)  TO [facility] STAFF: 2)  SEE MY ENCLOSE NOTE 3)  add miralax 4)  see new  tylenol instructions 5)  will keep patient on lorazepam as needed  6)  call if patient has more nausea or if she has N-V - diarrhea  7)  call if patient has more ear pain 8)  diet-- per note 12-05-09 9)  needs a urine culture, please send the results to [place] order enclosed

## 2010-12-19 NOTE — Miscellaneous (Signed)
Summary: OT Orders/[facility]  OT Orders/[facility]   Imported By: Lanelle Bal 09/20/2010 12:24:39  _____________________________________________________________________  External Attachment:    Type:   Image     Comment:   External Document

## 2010-12-19 NOTE — Miscellaneous (Signed)
Summary: PT & OT Eval Order/[facility]  PT & OT Eval Order/[facility]   Imported By: Lanelle Bal 09/09/2010 10:56:01  _____________________________________________________________________  External Attachment:    Type:   Image     Comment:   External Document

## 2010-12-19 NOTE — Progress Notes (Signed)
Summary: not feeling well, needs orders  Phone Note Other Incoming   Caller: Ralph Dowdy Summary of Call: Spoke to nurse, April [phone]) she states pt has not been feeling well. Has decreased appetite, O2 sat is 87-88% and is having to wear O2 constantly instead of as needed. Nurse states that pt. has history of UTIs. Temp today was 99.0.  Please advise.  Mervin Kung CMA  May 12, 2010 1:08 PM   Follow-up for Phone Call        please send a UA and urine culture If she is feeling a lot worse, needs to go to the ER or urgent care during the week ... particularly if fever, cough, chest pain otherwise, bring her here next week Nick Armel E. Sean Macwilliams MD  May 12, 2010 3:13 PM   Additional Follow-up for Phone Call Additional follow up Details #1::        Order given to April at [facility]. Notified Dee, pt's daughter of order.  Mervin Kung CMA  May 12, 2010 4:28 PM

## 2010-12-19 NOTE — Miscellaneous (Signed)
  Clinical Lists Changes  Observations: Added new observation of ECHOINTERP: - Left ventricle: LVEF is approximately 30% with akinesis and       aneurysmal dilitation of the distal 1/3 of LV.Mid 1/3is mildly       hypokinetc. NOte that LVEF had similar wall motion changes in 2005       echo. The cavity size was normal. Wall thickness was increased in       a pattern of mild LVH. Doppler parameters are consistent with       abnormal left ventricular relaxation (grade 1 diastolic       dysfunction).     - Mitral valve: Mild regurgitation.     - Right atrium: Echobright region along free wall of RA near       tricuspid annulus. May represent artifact. Note it was present in       echo from Feb. 2005.     - Pericardium, extracardiac: A trivial pericardial effusion was       identified.     Transthoracic echocardiography. M-mode, complete 2D, spectral     Doppler, and color Doppler. Height: Height: 172.7cm. Height: 68in.     Weight: Weight: 65.8kg. Weight: 144.7lb. Body mass index: BMI:     22kg/m 2. Body surface area: BSA: 1.67m 2. Patient status:     Outpatient. Location: [facility]            --------------------------------------------------------------------        (11/24/2009 11:32)      Echocardiogram  Procedure date:  11/24/2009  Findings:      - Left ventricle: LVEF is approximately 30% with akinesis and       aneurysmal dilitation of the distal 1/3 of LV.Mid 1/3is mildly       hypokinetc. NOte that LVEF had similar wall motion changes in 2005       echo. The cavity size was normal. Wall thickness was increased in       a pattern of mild LVH. Doppler parameters are consistent with       abnormal left ventricular relaxation (grade 1 diastolic       dysfunction).     - Mitral valve: Mild regurgitation.     - Right atrium: Echobright region along free wall of RA near       tricuspid annulus. May represent artifact. Note it was present in       echo from Feb.  2005.     - Pericardium, extracardiac: A trivial pericardial effusion was       identified.     Transthoracic echocardiography. M-mode, complete 2D, spectral     Doppler, and color Doppler. Height: Height: 172.7cm. Height: 68in.     Weight: Weight: 65.8kg. Weight: 144.7lb. Body mass index: BMI:     22kg/m 2. Body surface area: BSA: 1.2m 2. Patient status:     Outpatient. Location: [facility]            --------------------------------------------------------------------

## 2010-12-19 NOTE — Progress Notes (Signed)
Summary: x-ray showed osteopenia, several compression fractures   Phone Note Outgoing Call   Summary of Call: x-rays show osteopenia and  several vertebral fractures if the  back pain continue or if she has fever and not feeling well, she will need further workup to find out the acuity of the fractures as well as to rule out other process that can mimic vertebral fractures ( infection, metastatic disease) Please check on the patient  and let me  know Jose E. Paz MD  June 06, 2010 2:45 PM   Follow-up for Phone Call        left message on home number to call back. Army Fossa CMA  June 06, 2010 3:01 PM   Additional Follow-up for Phone Call Additional follow up Details #1::        left message for pt to call back. Army Fossa CMA  June 08, 2010 9:21 AM  [email or web address] Barban  June 09, 2010 10:13 AM    Additional Follow-up for Phone Call Additional follow up Details #2::    Called and spoke with Dondra Spry, Med Tech @ [facility] where Ms. Riddell lives and she said she has still been c/o back pain. No fever and c/o anything else, just the back pain. This morning she actually asked to go back to bed bc of the pain. Dondra Spry told me she would pass our call onto the nurse April when she came in. I told her I would pass this information on to Dr. Drue Novel to see what he would like to do now.  Follow-up by: Harold Barban,  June 09, 2010 10:20 AM  Additional Follow-up for Phone Call Additional follow up Details #3:: Details for Additional Follow-up Action Taken: schedule a OV next week  (if pain continue to be severe consider MRI to determine if Fx are recent, ?vertebroplasty) Needs Norco 5mg  q 6 hours as needed pain #30 if not already on it  Winston E. Paz MD  June 13, 2010 9:56 AM   Spoke w/ Amy at [facility] and patient is getting medication two times a day but she was instructed that if she need medication in between am and pm dose then all she has to do is ask, says that when she is offer medication after  complaining of back that she says that she will just wait until she takes her meds in the evening informed that she has appt scheduled next week to f/u w/ Dr. Drue Novel about back pain after speaking w/ daughter and daughter is also aware of xray results .......Marland KitchenDoristine Devoid CMA  June 13, 2010 1:55 PM

## 2010-12-19 NOTE — Progress Notes (Signed)
Summary: [facility]  [facility]   Imported By: Lanelle Bal 12/15/2009 15:30:46  _____________________________________________________________________  External Attachment:    Type:   Image     Comment:   External Document

## 2010-12-19 NOTE — Progress Notes (Signed)
Summary: QUESTIONS BY NURSE AT [facility]  Phone Note Other Incoming   Caller: NURSE APRIL AT [facility] = [phone] Summary of Call: APRIL CALLED TO SPEAK TO DANIELLE TO SEE IF DR PAZ HAD RECEIVED PAPERWORK ABOUT THIS PATIENT THAT WAS FAXED OVER YESTERDAY??---SAYS SINCE PATIENT HAS HAD RECENT FALLS AND HAS MACULAR DEGENERATION,  WOULD DR PAZ WRITE AN ORDER FOR PATIENT TO PARTICIPATE IN AN "OT VISION PROGRAM"  THAT WORKS WITH PATIENTS THAT HAVE MACULAR DEGENERATION TO TEACH THEM TO USE THE VISION THAT THEY HAVE Initial call taken by: Jerolyn Shin,  August 17, 2010 2:00 PM  Follow-up for Phone Call        pls advise if paperwork received..............Marland KitchenFelecia Deloach CMA  August 17, 2010 2:27 PM   Additional Follow-up for Phone Call Additional follow up Details #1::        faxed paperwork back to April.  Additional Follow-up by: Army Fossa CMA,  August 17, 2010 3:43 PM

## 2010-12-19 NOTE — Miscellaneous (Signed)
Summary: Labs from Dr.Deveshwar   Clinical Lists Changes  Observations: Added new observation of PLATELETS: 240 10*3/mm3 (09/04/2010 15:10) Added new observation of HGB: 13.3 g/dL (16/08/9603 54:09) Added new observation of WBC: 11.2 10*3/mm3 (09/04/2010 15:10)

## 2010-12-19 NOTE — Progress Notes (Signed)
Summary: refill  Phone Note Refill Request Message from:  Fax from Pharmacy on Apr 13, 2010 8:43 AM  Refills Requested: Medication #1:  HYDROCODONE/APAP 5/325 TA TAKE 1 TAB BY MOUTH TWICE A DAY fax from [facility] - fax [phone] - ph [phone]   Method Requested: Fax to Local Pharmacy Initial call taken by: Okey Regal [place],  Apr 13, 2010 8:49 AM  Follow-up for Phone Call        #100 with 2 refills on 12/30/07.....Marland KitchenMarland KitchenShary Decamp  Apr 14, 2010 9:42 AM ok 50, no RF Tarun Patchell E. Marvelous Bouwens MD  Apr 14, 2010 4:52 PM     New/Updated Medications: HYDROCODONE-ACETAMINOPHEN 5-325 MG TABS (HYDROCODONE-ACETAMINOPHEN) 1 by mouth bid Prescriptions: HYDROCODONE-ACETAMINOPHEN 5-325 MG TABS (HYDROCODONE-ACETAMINOPHEN) 1 by mouth bid  #50 x 0   Entered by:   Shary Decamp   Authorized by:   Nolon Rod. Francyne Arreaga MD   Signed by:   Shary Decamp on 04/18/2010   Method used:   Printed then faxed to ...       [facility]  [address] [phone][facility] (retail)       [address]       [place], [place]  [id number]       Ph: [phone] or [phone]       Fax: [phone]   RxID:   [id number]

## 2010-12-19 NOTE — Miscellaneous (Signed)
Summary: med change  Medications Added HYDROCODONE-ACETAMINOPHEN 5-325 MG TABS (HYDROCODONE-ACETAMINOPHEN) 1 every 8 hrs as needed for pain.       Clinical Lists Changes  Medications: Changed medication from HYDROCODONE-ACETAMINOPHEN 5-325 MG TABS (HYDROCODONE-ACETAMINOPHEN) 1 by mouth three times a day to HYDROCODONE-ACETAMINOPHEN 5-325 MG TABS (HYDROCODONE-ACETAMINOPHEN) 1 every 8 hrs as needed for pain.

## 2010-12-19 NOTE — Progress Notes (Signed)
Summary: Refill Request  Phone Note Refill Request Call back at [phone] Message from:  Pharmacy on June 08, 2010 11:24 AM  Refills Requested: Medication #1:  ESTRACE 0.1 MG/GM CREA Insert 1 gram into vagina once every monday   Dosage confirmed as above?Dosage Confirmed   Supply Requested: 1 month [facility]   Next Appointment Scheduled: none Initial call taken by: Lavell Islam,  June 08, 2010 11:24 AM    Prescriptions: ESTRACE 0.1 MG/GM CREA (ESTRADIOL) Insert 1 gram into vagina once every monday,wed,and friday  #43 Gram x 0   Entered by:   Army Fossa CMA   Authorized by:   Nolon Rod. Paz MD   Signed by:   Army Fossa CMA on 06/08/2010   Method used:   Printed then faxed to ...       [facility]  [address] [phone]* (retail)       [address]       [place], [place]  [id number]       Ph: [phone] or [phone]       Fax: [phone]   RxID:   [id number]  Sent to [facility] not Drue Flirt CMA  June 08, 2010 11:32 AM

## 2010-12-19 NOTE — Miscellaneous (Signed)
Summary: Care Plan/[facility]  Care Plan/[facility]   Imported By: Lanelle Bal 12/20/2009 08:04:18  _____________________________________________________________________  External Attachment:    Type:   Image     Comment:   External Document

## 2010-12-19 NOTE — Assessment & Plan Note (Signed)
Summary: SORE THROAT/RH......   Vital Signs:  Patient profile:   77 year old female Weight:      151 pounds Pulse rate:   60 / minute BP sitting:   110 / 60  (left arm)  Vitals Entered By: Doristine Devoid (January 27, 2010 11:38 AM) CC: sore throat, also having urinary sx, and would like spot on R cheek checked    History of Present Illness: developed a sore throat last night,  many  others in her  community are sick as well  denies fever, runny nose, cough she did have her flu shot  history of recurring UTIs, denies dysuria or nocturia but likes her urine checked  past the new skin lesion in the right cheek for few months  Allergies: 1)  ! Imdur  Past History:  Past Medical History: Ischemic cardiomyopathy.  This was presumed based on EKG and echocardiogram.  Her EKG shows evidence for an old anterior MI. Most recent echo in 1/11 with EF 30%, apical aneurysm, mild LVH, grade I diastolic dysfunction, mild MR, no AS.   Mild aspiration per swallow test 1-11 Pulmonary fibrosis--COPD  Patient was on home O2 at one point at night   There is a question of whether her pulmonary fibrosis was triggered as an adverse reaction to Macrodantin. chronic UTIs, Sepsis- UTI w/ MS changes 10-09 Status post left hip fracture with pinning in September 2009. History of falls. Possible TIAs. GERD Macular degeneration. Hypercholesterolemia.  DEMENTIA, SENILE, UNCOMPLICATED   Osteoarthritis, chronic neck pain   Past Surgical History: Reviewed history from 08/12/2009 and no changes required. Cholecystectomy Hysterectomy Oophorectomy Spinal fusion,x 3 ,  lower spine hip fracture 07-2008  Social History: Reviewed history from 12/19/2009 and no changes required. The patient is a resident of  [facility].  widow Daughter Dione Booze lives in [place], smoked for 30 years but quit over 30 years  ago.  Nondrinker.   Has no history of drug abuse.      Review of  Systems      See HPI  Physical Exam  General:  alert and well-developed.   Head:  at the right cheek, she has a 0.8 cm dark lesion   Nose:  not congested Mouth:  dry membranes, throat is slightly red, no discharge Lungs:  normal respiratory effort and no intercostal retractions.   decreased breath sounds, very fine crackles at bases bilaterally but otherwise clear Heart:  s1/s2, + systolic murmur  Abdomen:  soft, non-tender, and no distention.     Impression & Recommendations:  Problem # 1:  SKIN LESION (ICD-709.9) skin lesion in the right face for few months, looks like seborrheik keratosis, I discussed with the family that to be 100% sure she will need a biopsy or a referal to derm the daughter elected a  REFERAL  Orders: Dermatology Referral (Derma)  Problem # 2:  SORE THROAT (ICD-462) rapid strep test (-)  see instructions  The following medications were removed from the medication list:    Cipro 250 Mg Tabs (Ciprofloxacin hcl) .Marland Kitchen..Marland Kitchen Two times a day x 7 days Her updated medication list for this problem includes:    Tylenol Arthritis Pain 650 Mg Cr-tabs (Acetaminophen) ..... One tablet every morning, then one tablet every 6 hours as needed    Aspirin 81 Mg Tbec (Aspirin) .Marland Kitchen... Take one tablet by mouth daily  Problem # 3:  URINARY TRACT INFECTION, RECURRENT (ICD-599.0) recurrent  UTIs, currently asymptomatic, Udip : trace  infection? She is currently on Flomax, vaginal Estrace, in and out catheterization every other day she did very well previously with daily Macrobid but this was discontinue because there was a question of lung problems with it plan urine culture, treat with results  will consider low dose of daily ciprofloxacin    Orders: UA Dipstick w/o Micro (manual) (16109) Specimen Handling (99000) T-Culture, Urine ([id number]) T-Urine Microscopic ([id number])  Complete Medication List: 1)  Coreg 6.25 Mg Tabs (Carvedilol) .Marland Kitchen.. 1(1/2 ) by mouth two times a  day 2)  Cozaar 50 Mg Tabs (Losartan potassium) .... Take 1 tablet by mouth once a day 3)  Furosemide 40 Mg Tabs (Furosemide) .... Once daily 4)  Nitro-dur 0.3 Mg/hr Pt24 (Nitroglycerin) .... Apply 1 patch every 12 hours remove @ night 5)  Nexium 40 Mg Cpdr (Esomeprazole magnesium) .... Take 1 capsule by mouth once a day 6)  Estrace 0.1 Mg/gm Crea (Estradiol) .... Insert 1 gram into vagina once every monday,wed,and friday 7)  Fosamax 70 Mg Tabs (Alendronate sodium) .Marland Kitchen.. 1 tablet by mouth once a week 8)  Zolpidem Tartrate 5 Mg Tabs (Zolpidem tartrate) .Marland Kitchen.. 1 by mouth at bedtime 9)  Amitriptyline Hcl 25 Mg Tabs (Amitriptyline hcl) .Marland Kitchen.. 1 by mouth qhs 10)  Atrovent Hfa 17 Mcg/act Aers (Ipratropium bromide hfa) .... 2 puffs four times a day as needed 11)  Calcium + D 600  .... Two times a day 12)  Nitroglycerin 0.4 Mg Subl (Nitroglycerin) .Marland Kitchen.. 1 tab sl every 5 min as needed chest pain 13)  Tylenol Arthritis Pain 650 Mg Cr-tabs (Acetaminophen) .... One tablet every morning, then one tablet every 6 hours as needed 14)  Hydroxyzine Hcl 25 Mg Tabs (Hydroxyzine hcl) .... Two times a day as needed itching 15)  Digoxin 0.125 Mg Tabs (Digoxin) .Marland Kitchen.. 1 by mouth once daily 16)  Flomax 0.4 Mg Xr24h-cap (Tamsulosin hcl) .... Once daily 17)  Lorazepam 0.5 Mg Tabs (Lorazepam) .... One tablet by mouth twice a day as needed for anxiety, agitation or insomnia 18)  Zocor 20 Mg Tabs (Simvastatin) .Marland Kitchen.. 1 daily 19)  Aspirin 81 Mg Tbec (Aspirin) .... Take one tablet by mouth daily 20)  Colace 100 Mg Caps (Docusate sodium) .... One cap by mouth bid 21)  Miralax Powd (Polyethylene glycol 3350) .Marland KitchenMarland KitchenMarland Kitchen 17 g daily with fluids  Patient Instructions: 1)  drink plenty of fluids 2)  call me if your sore throat get worse or if fever or if no better in few days 3)  robitussin DM 1 or 2 tsp every 6 hours as needed for cough - congestion  Laboratory Results   Urine Tests    Routine Urinalysis   Glucose: negative   (Normal  Range: Negative) Bilirubin: negative   (Normal Range: Negative) Ketone: negative   (Normal Range: Negative) Spec. Gravity: <1.005   (Normal Range: 1.003-1.035) Blood: negative   (Normal Range: Negative) pH: 6.0   (Normal Range: 5.0-8.0) Protein: negative   (Normal Range: Negative) Urobilinogen: 0.2   (Normal Range: 0-1) Nitrite: positive   (Normal Range: Negative) Leukocyte Esterace: small   (Normal Range: Negative)

## 2010-12-19 NOTE — Miscellaneous (Signed)
Summary: Order / [facility]  Order / [facility]   Imported By: Lennie Odor 08/25/2010 11:04:07  _____________________________________________________________________  External Attachment:    Type:   Image     Comment:   External Document

## 2010-12-19 NOTE — Progress Notes (Signed)
Summary: Refill Request  Phone Note Refill Request Call back at [phone] Message from:  Pharmacy on May 08, 2010 8:16 AM  Refills Requested: Medication #1:  ZOLPIDEM TARTRATE 5 MG  TABS 1 by mouth at bedtime   Dosage confirmed as above?Dosage Confirmed   Supply Requested: 1 month [facility] [address] [employer] in [place]  Next Appointment Scheduled: none Initial call taken by: Harold Barban,  May 08, 2010 8:17 AM  Follow-up for Phone Call        PER PHARMACY ORIGINAL RX RECEIVED ON 05-01-10 DISREGARD REQUEST....Marland KitchenMarland KitchenFelecia Deloach CMA  May 09, 2010 10:52 AM

## 2010-12-19 NOTE — Progress Notes (Signed)
Summary: ?urology referral  Phone Note Outgoing Call Call back at [facility] Phone [phone]   Summary of Call: discussed with daughter u.cx results & urology referral.  Patient has seen Dr. Vonita Moss in the past.  He "can't do anything else for her".  His last suggestion was to have pt cath daily.  She did try that but that did not help with chronic UTIs.  Dr. Vonita Moss stated that her bladder "never completely empties".  Daughter does not want to go back to urology.  Suggestions? Shary Decamp  March 06, 2010 10:19 AM   Follow-up for Phone Call        in and out bladder catheterization with each urination; he may be difficult and even unpractical but I don't have any other suggestions Jose E. Paz MD  March 06, 2010 5:01 PM   discussed with daughter Shary Decamp  March 08, 2010 1:11 PM

## 2010-12-19 NOTE — Assessment & Plan Note (Signed)
Summary: FELL 3 WKS AGO, DISCUSS PAIN MED, POSSIBLE MRI?? ///SP[facility]   Vital Signs:  Patient profile:   77 year old female Height:      67 inches Weight:      143 pounds BMI:     22.48 Pulse rate:   64 / minute Pulse rhythm:   regular BP sitting:   112 / 70  (left arm) Cuff size:   regular  Vitals Entered By: Army Fossa CMA (June 20, 2010 1:48 PM) CC: Fell 3 weeks ago, had x-rays done. Comments Pt still in pain. Has been using tylenol to help with the pain   History of Present Illness: last office visit April 2011,  since then  she had several problems fell 3  weeks ago, developed severe pain in the back, much worse on the lower back and the upper back Currently on tylenol , pain is still severe per patient a plain x-ray of the back shows severe osteopenia and several vertebral fractures since the fall, her chronic left pain has increased, is extremely sharp and on the side of the left hip  She also had UTIs x2 Status post 2 antibiotics  ROS No fever No dysuria just "pressure in the bladder" They are doing in and out bladder catheterization 2 to 3 times a week, impractical to do it more often.  chart reviewed Urine culture 4-11 showed Escherichia coli, was treated with antibiotics Urine culture from 05-12-10 showed enterococcus, status post Macrobid    Current Medications (verified): 1)  Coreg 6.25 Mg  Tabs (Carvedilol) .Marland Kitchen.. 1(1/2 ) By Mouth Two Times A Day 2)  Cozaar 50 Mg Tabs (Losartan Potassium) .... Take 1 Tablet By Mouth Once A Day 3)  Furosemide 40 Mg Tabs (Furosemide) .... Once Daily 4)  Nitro-Dur 0.3 Mg/hr Pt24 (Nitroglycerin) .... Apply 1 Patch Every 12 Hours Remove @ Night 5)  Nexium 40 Mg Cpdr (Esomeprazole Magnesium) .... Take 1 Capsule By Mouth Once A Day 6)  Estrace 0.1 Mg/gm Crea (Estradiol) .... Insert 1 Gram Into Vagina Once Every Monday,wed,and Friday 7)  Fosamax 70 Mg Tabs (Alendronate Sodium) .Marland Kitchen.. 1 Tablet By Mouth Once A Week 8)  Zolpidem  Tartrate 5 Mg  Tabs (Zolpidem Tartrate) .Marland Kitchen.. 1 By Mouth At Bedtime As Needed. 9)  Amitriptyline Hcl 25 Mg  Tabs (Amitriptyline Hcl) .Marland Kitchen.. 1 By Mouth Qhs 10)  Atrovent Hfa 17 Mcg/act  Aers (Ipratropium Bromide Hfa) .... 2 Puffs Four Times A Day As Needed 11)  Calcium + D 600 .... Two Times A Day 12)  Nitroglycerin 0.4 Mg Subl (Nitroglycerin) .Marland Kitchen.. 1 Tab Sl Every 5 Min As Needed Chest Pain 13)  Tylenol Arthritis Pain 650 Mg Cr-Tabs (Acetaminophen) .... One Tablet Every Morning, Then One Tablet Every 6 Hours As Needed 14)  Hydroxyzine Hcl 25 Mg Tabs (Hydroxyzine Hcl) .... Two Times A Day As Needed Itching 15)  Digoxin 0.125 Mg Tabs (Digoxin) .Marland Kitchen.. 1 By Mouth Once Daily 16)  Flomax 0.4 Mg Xr24h-Cap (Tamsulosin Hcl) .... Once Daily 17)  Lorazepam 0.5 Mg Tabs (Lorazepam) .... One Tablet By Mouth Twice A Day As Needed For Anxiety, Agitation or Insomnia 18)  Zocor 20 Mg Tabs (Simvastatin) .Marland Kitchen.. 1 Daily 19)  Aspirin 81 Mg Tbec (Aspirin) .... Take One Tablet By Mouth Daily 20)  Colace 100 Mg Caps (Docusate Sodium) .... One Cap By Mouth Bid 21)  Miralax  Powd (Polyethylene Glycol 3350) .Marland KitchenMarland KitchenMarland Kitchen 17 G Daily With Fluids 22)  Hydrocodone-Acetaminophen 5-325 Mg Tabs (Hydrocodone-Acetaminophen) .Marland KitchenMarland KitchenMarland Kitchen 1  By Mouth Bid 23)  Promethazine Hcl 12.5 Mg Tabs (Promethazine Hcl) .... 1/2 Tab By Mouth Two Times A Day 24)  Acetaminophen 650 Mg Cr-Tabs (Acetaminophen) .... Q6h As Needed For Pain/fever/ha  Allergies: 1)  ! Imdur  Past History:  Past Medical History: Reviewed history from 01/27/2010 and no changes required. Ischemic cardiomyopathy.  This was presumed based on EKG and echocardiogram.  Her EKG shows evidence for an old anterior MI. Most recent echo in 1/11 with EF 30%, apical aneurysm, mild LVH, grade I diastolic dysfunction, mild MR, no AS.   Mild aspiration per swallow test 1-11 Pulmonary fibrosis--COPD  Patient was on home O2 at one point at night   There is a question of whether her pulmonary fibrosis was  triggered as an adverse reaction to Macrodantin. chronic UTIs, Sepsis- UTI w/ MS changes 10-09 Status post left hip fracture with pinning in September 2009. History of falls. Possible TIAs. GERD Macular degeneration. Hypercholesterolemia.  DEMENTIA, SENILE, UNCOMPLICATED   Osteoarthritis, chronic neck pain   Past Surgical History: Reviewed history from 08/12/2009 and no changes required. Cholecystectomy Hysterectomy Oophorectomy Spinal fusion,x 3 ,  lower spine hip fracture 07-2008  Social History: Reviewed history from 12/19/2009 and no changes required. The patient is a resident of  [facility].  widow Daughter Dione Booze lives in [place], smoked for 30 years but quit over 30 years  ago.  Nondrinker.   Has no history of drug abuse.      Physical Exam  General:  alert and well-developed.   Msk:  moderate tenderness to palpation in the lower back Extremities:  no lower extremity edema Right hip with essentially normal range of motion Left hip with slightly decreased range of motion, quite tender on the lateral aspect of the eighth without redness or warmness Neurologic:  not oriented in time but knows her name and place very pleasent  demeanor, she is very aware of her situation, her recent UTIs and pain medication prescriptions.  Lower extremity motor exam symmetric Reflexes symmetric, bilateral decreased ankle jerk   Impression & Recommendations:  Problem # 1:  VERTEBRAL FRACTURE (ICD-805.8)  see history of present illness severe back pain since a fall 3 weeks ago, x-ray shows several vertebral factors, patient is more in the lower back. Discussed with patient her options. We could keep giving her pain medications or pursue MRI and possible vertebro plasty if the MRI determines which fracture is acute. pt elected to pursue a  MRI We'll arrange  Orders: Radiology Referral (Radiology)  Problem # 2:  URINARY TRACT INFECTION,  RECURRENT (ICD-599.0) see history of present illness, status post 2 UTIs recently She is currently on Flomax, vaginal Estrace, in and out catheterization 3 times a week she did very well previously with daily Macrobid but this was discontinue because there was a question of lung problems with it  We had a frank conversation with the patient's daughter, she is in favor of  restart daily Macrobid because she has not have any lung problems in a while and with every UTI she gets weaker and weaker. Plan: Urine culture Start the Macrobid daily In and out cath  daily  Problem # 3:  OSTEOPOROSIS (ICD-733.00) history of osteoporosis, tolerates Fosamax well Has not had a bone density test in a while and was not interested in one last time we talked Plan: Continue Fosamax, check a vitamin D Her updated medication list for this problem includes:    Fosamax 70  Mg Tabs (Alendronate sodium) .Marland Kitchen... 1 tablet by mouth once a week  Orders: T-Vitamin D (25-Hydroxy) (16109-60454)  Problem # 4:  HIP FRACTURE, LEFT (ICD-820.8) chronic pain worse since the fall 3  weeks ago Exam show moderate tenderness at the bursa Recommend to see her orthopedic surgeon (Dr Despina Hick)  Problem # 5:  CONGESTIVE HEART FAILURE (ICD-428.0)  seems stable, check a BNP Her updated medication list for this problem includes:    Coreg 6.25 Mg Tabs (Carvedilol) .Marland Kitchen... 1(1/2 ) by mouth two times a day    Cozaar 50 Mg Tabs (Losartan potassium) .Marland Kitchen... Take 1 tablet by mouth once a day    Furosemide 40 Mg Tabs (Furosemide) ..... Once daily    Digoxin 0.125 Mg Tabs (Digoxin) .Marland Kitchen... 1 by mouth once daily    Aspirin 81 Mg Tbec (Aspirin) .Marland Kitchen... Take one tablet by mouth daily  Orders: Venipuncture (09811) TLB-BMP (Basic Metabolic Panel-BMET) (80048-METABOL) Specimen Handling (91478)  Echocardiogram: - Left ventricle: LVEF is approximately 30% with akinesis and       aneurysmal dilitation of the distal 1/3 of LV.Mid 1/3is mildly        hypokinetc. NOte that LVEF had similar wall motion changes in 2005       echo. The cavity size was normal. Wall thickness was increased in       a pattern of mild LVH. Doppler parameters are consistent with       abnormal left ventricular relaxation (grade 1 diastolic       dysfunction).     - Mitral valve: Mild regurgitation.     - Right atrium: Echobright region along free wall of RA near       tricuspid annulus. May represent artifact. Note it was present in       echo from Feb. 2005.     - Pericardium, extracardiac: A trivial pericardial effusion was       identified.     Transthoracic echocardiography. M-mode, complete 2D, spectral     Doppler, and color Doppler. Height: Height: 172.7cm. Height: 68in.     Weight: Weight: 65.8kg. Weight: 144.7lb. Body mass index: BMI:     22kg/m 2. Body surface area: BSA: 1.73m 2. Patient status:     Outpatient. Location: [facility]            --------------------------------------------------------------------        (11/24/2009)  Complete Medication List: 1)  Coreg 6.25 Mg Tabs (Carvedilol) .Marland Kitchen.. 1(1/2 ) by mouth two times a day 2)  Cozaar 50 Mg Tabs (Losartan potassium) .... Take 1 tablet by mouth once a day 3)  Furosemide 40 Mg Tabs (Furosemide) .... Once daily 4)  Nitro-dur 0.3 Mg/hr Pt24 (Nitroglycerin) .... Apply 1 patch every 12 hours remove @ night 5)  Nexium 40 Mg Cpdr (Esomeprazole magnesium) .... Take 1 capsule by mouth once a day 6)  Estrace 0.1 Mg/gm Crea (Estradiol) .... Insert 1 gram into vagina once every monday,wed,and friday 7)  Fosamax 70 Mg Tabs (Alendronate sodium) .Marland Kitchen.. 1 tablet by mouth once a week 8)  Zolpidem Tartrate 5 Mg Tabs (Zolpidem tartrate) .Marland Kitchen.. 1 by mouth at bedtime as needed. 9)  Amitriptyline Hcl 25 Mg Tabs (Amitriptyline hcl) .Marland Kitchen.. 1 by mouth qhs 10)  Atrovent Hfa 17 Mcg/act Aers (Ipratropium bromide hfa) .... 2 puffs four times a day as needed 11)  Calcium + D 600  .... Two times a day 12)  Nitroglycerin  0.4 Mg Subl (Nitroglycerin) .Marland Kitchen.. 1 tab sl every 5  min as needed chest pain 13)  Tylenol Arthritis Pain 650 Mg Cr-tabs (Acetaminophen) .... One tablet every morning, then one tablet every 6 hours as needed 14)  Hydroxyzine Hcl 25 Mg Tabs (Hydroxyzine hcl) .... Two times a day as needed itching 15)  Digoxin 0.125 Mg Tabs (Digoxin) .Marland Kitchen.. 1 by mouth once daily 16)  Flomax 0.4 Mg Xr24h-cap (Tamsulosin hcl) .... Once daily 17)  Lorazepam 0.5 Mg Tabs (Lorazepam) .... One tablet by mouth twice a day as needed for anxiety, agitation or insomnia 18)  Zocor 20 Mg Tabs (Simvastatin) .Marland Kitchen.. 1 daily 19)  Aspirin 81 Mg Tbec (Aspirin) .... Take one tablet by mouth daily 20)  Colace 100 Mg Caps (Docusate sodium) .... One cap by mouth bid 21)  Miralax Powd (Polyethylene glycol 3350) .Marland KitchenMarland KitchenMarland Kitchen 17 g daily with fluids 22)  Hydrocodone-acetaminophen 5-325 Mg Tabs (Hydrocodone-acetaminophen) .Marland Kitchen.. 1 by mouth bid 23)  Promethazine Hcl 12.5 Mg Tabs (Promethazine hcl) .... 1/2 tab by mouth two times a day 24)  Acetaminophen 650 Mg Cr-tabs (Acetaminophen) .... Q6h as needed for pain/fever/ha 25)  Macrodantin 50 Mg Caps (Nitrofurantoin macrocrystal) .... One by mouth daily  Other Orders: T-Urine Microscopic ([id number]) T-Culture, Urine ([id number]) UA Dipstick w/o Micro (manual) (47425)  Patient Instructions: 1)  start Macrodantin 50 mg one by mouth daily 2)  Will schedule a MRI 3)  Please get in and out bladder catheterization daily 4)  Please schedule a follow-up appointment in 2 months.  Prescriptions: MACRODANTIN 50 MG CAPS (NITROFURANTOIN MACROCRYSTAL) one by mouth daily  #90 x 3   Entered and Authorized by:   Elita Quick E. Paz MD   Signed by:   Nolon Rod. Paz MD on 06/20/2010   Method used:   Print then Give to Patient   RxID:   [id number]   Laboratory Results   Urine Tests    Routine Urinalysis   Color: lt. yellow Appearance: Cloudy Glucose: negative   (Normal Range: Negative) Bilirubin: negative    (Normal Range: Negative) Ketone: negative   (Normal Range: Negative) Spec. Gravity: 1.020   (Normal Range: 1.003-1.035) Blood: negative   (Normal Range: Negative) pH: 6.5   (Normal Range: 5.0-8.0) Protein: negative   (Normal Range: Negative) Urobilinogen: 0.2   (Normal Range: 0-1) Nitrite: negative   (Normal Range: Negative) Leukocyte Esterace: negative   (Normal Range: Negative)

## 2010-12-19 NOTE — Miscellaneous (Signed)
Summary: PT Orders/[facility]  PT Orders/[facility]   Imported By: Lanelle Bal 10/24/2010 14:14:51  _____________________________________________________________________  External Attachment:    Type:   Image     Comment:   External Document

## 2010-12-19 NOTE — Progress Notes (Signed)
Summary: Refill Request  Phone Note Refill Request Call back at [phone] Message from:  Pharmacy on May 01, 2010 2:15 PM  Refills Requested: Medication #1:  ZOLPIDEM TARTRATE 5 MG  TABS 1 by mouth at bedtime   Dosage confirmed as above?Dosage Confirmed   Supply Requested: 1 month [facility] [address] [place] [id number]  Next Appointment Scheduled: none Initial call taken by: Harold Barban,  May 01, 2010 2:16 PM  Follow-up for Phone Call        last OV 02-28-10. last filled #30 6 refills........Marland KitchenFelecia Deloach CMA  May 01, 2010 5:07 PM  ok 30 and 6 RF Mirabella Hilario E. Sakai Wolford MD  May 02, 2010 1:40 PM     Prescriptions: ZOLPIDEM TARTRATE 5 MG  TABS (ZOLPIDEM TARTRATE) 1 by mouth at bedtime  #30 x 6   Entered by:   Jeremy Johann CMA   Authorized by:   Nolon Rod. Talonda Artist MD   Signed by:   Jeremy Johann CMA on 05/02/2010   Method used:   Printed then faxed to ...       [facility]  [address] [phone]* (retail)       [address]       [place], [place]  [id number]       Ph: [phone] or [phone]       Fax: [phone]   RxID:   [id number]

## 2010-12-19 NOTE — Miscellaneous (Signed)
Summary: Care Plan/[facility]  Care Plan/[facility]   Imported By: Lanelle Bal 09/20/2010 15:31:52  _____________________________________________________________________  External Attachment:    Type:   Image     Comment:   External Document

## 2010-12-19 NOTE — Consult Note (Signed)
Summary: [facility]  [facility]   Imported By: Lanelle Bal 08/21/2010 09:01:01  _____________________________________________________________________  External Attachment:    Type:   Image     Comment:   External Document

## 2010-12-19 NOTE — Consult Note (Signed)
Summary: [facility] consult,Rx observe x 2 weeks   [place]   Imported By: Sherian Rein 07/27/2010 14:17:48  _____________________________________________________________________  External Attachment:    Type:   Image     Comment:   External Document

## 2010-12-19 NOTE — Progress Notes (Signed)
Summary: PAIN  Phone Note Call from Patient Call back at [phone]   Caller: [facility] Summary of Call: [facility] sent a fax because pt is in pain. Having lower back pain x 48 hrs. Cont ABT/UTT, no adverse effects. Voiding difficulty. They want to cont scheduled Tylenol arthritis and Norco 5/325 two times a day and as needed. Pt stated " I need an x-ray because it hurts so bad." I placed the order form on your ledge for you to review and sign with new orders. Army Fossa CMA  May 31, 2010 9:07 AM   Follow-up for Phone Call        is hard to assess what is going on with else in the patient. If she has severe symptoms, fever, nausea vomiting ---she is to go to the ER or schedule a visit here Okay to continue Tylenol and Norco as above Follow-up by: Vasili Fok E. Tayana Shankle MD,  May 31, 2010 12:43 PM  Additional Follow-up for Phone Call Additional follow up Details #1::        I spoke with April at [facility] they are aware, they are going to call pts daughter to let her know also so she can schedule an OV. April also states if you would like to get an X-ray done they can do that in house. Please advise if this is something you would like to do. Army Fossa CMA  May 31, 2010 12:49 PM  thoracic and lumbosacral spine x-ray dx-- back pain Girtrude Enslin E. Zarya Lasseigne MD  May 31, 2010 12:51 PM     Additional Follow-up for Phone Call Additional follow up Details #2::    will fax over order to [facility]. April is aware that we are ordering this there. Army Fossa CMA  May 31, 2010 12:54 PM   New/Updated Medications: * THORACIC AND LUMBOSACRAL SPINE X-RAY dx-- back pain In aPrescriptions: THORACIC AND LUMBOSACRAL SPINE X-RAY dx-- back pain  #1 x 0   Entered by:   Army Fossa CMA   Authorized by:   Nolon Rod. Zael Shuman MD   Signed by:   Army Fossa CMA on 05/31/2010   Method used:   Print then Give to Patient   RxID:   [id number]

## 2010-12-19 NOTE — Progress Notes (Signed)
Summary: Checking on pt   ---- Converted from flag ---- ---- 08/14/2010 11:08 AM, Jose E. Paz MD wrote: check on her ------------------------------  Left message for pt to call back. Army Fossa CMA  August 22, 2010 11:50 AM  Spoke with Dondra Spry @ [facility] and she said that Mrs. Nack is doing much better. She still has some brusing on that hip, but is not complaining of as much pain. She is also walking abour much better.Marland KitchenMarland KitchenHarold Barban  August 24, 2010 8:46 AM

## 2010-12-19 NOTE — Miscellaneous (Signed)
Summary: Notice of Patient Fall/[facility]  Notice of Patient Fall/[facility]   Imported By: Lanelle Bal 10/18/2010 14:10:01  _____________________________________________________________________  External Attachment:    Type:   Image     Comment:   External Document

## 2010-12-19 NOTE — Miscellaneous (Signed)
Summary: Physician Orders/[facility]  Physician Orders/[facility]   Imported By: Lanelle Bal 03/31/2010 13:48:24  _____________________________________________________________________  External Attachment:    Type:   Image     Comment:   External Document

## 2010-12-19 NOTE — Medication Information (Signed)
Summary: APAP & Ambien Change Request/[facility]  APAP & Ambien Change Request/[facility]   Imported By: Lanelle Bal 05/30/2010 13:21:05  _____________________________________________________________________  External Attachment:    Type:   Image     Comment:   External Document

## 2010-12-19 NOTE — Progress Notes (Signed)
Summary: order for u.cx  Phone Note From Other Clinic   Caller: fax from [facility]; fax # [phone] Summary of Call: Resident is c/o discomfort to bladder & feeling tired.  Stated that these are sxs she has when she has UTI.  Request order for ua, c&s.    - ok for UA, micro, & u.cx   - dx 599.0   - please fax results to [phone]  [facility]  November 23, 2009 9:04 AM

## 2010-12-19 NOTE — Assessment & Plan Note (Signed)
Summary: 1 month roa//lch   Vital Signs:  Patient profile:   77 year old female Height:      67 inches  Vitals Entered By: Shary Decamp (February 28, 2010 10:39 AM) Comments Patient does not need to be seen by MD.  No problems.  Just needs repeat u.cx Shary Decamp  February 28, 2010 11:00 AM    Allergies: 1)  ! Imdur   Complete Medication List: 1)  Coreg 6.25 Mg Tabs (Carvedilol) .Marland Kitchen.. 1(1/2 ) by mouth two times a day 2)  Cozaar 50 Mg Tabs (Losartan potassium) .... Take 1 tablet by mouth once a day 3)  Furosemide 40 Mg Tabs (Furosemide) .... Once daily 4)  Nitro-dur 0.3 Mg/hr Pt24 (Nitroglycerin) .... Apply 1 patch every 12 hours remove @ night 5)  Nexium 40 Mg Cpdr (Esomeprazole magnesium) .... Take 1 capsule by mouth once a day 6)  Estrace 0.1 Mg/gm Crea (Estradiol) .... Insert 1 gram into vagina once every monday,wed,and friday 7)  Fosamax 70 Mg Tabs (Alendronate sodium) .Marland Kitchen.. 1 tablet by mouth once a week 8)  Zolpidem Tartrate 5 Mg Tabs (Zolpidem tartrate) .Marland Kitchen.. 1 by mouth at bedtime 9)  Amitriptyline Hcl 25 Mg Tabs (Amitriptyline hcl) .Marland Kitchen.. 1 by mouth qhs 10)  Atrovent Hfa 17 Mcg/act Aers (Ipratropium bromide hfa) .... 2 puffs four times a day as needed 11)  Calcium + D 600  .... Two times a day 12)  Nitroglycerin 0.4 Mg Subl (Nitroglycerin) .Marland Kitchen.. 1 tab sl every 5 min as needed chest pain 13)  Tylenol Arthritis Pain 650 Mg Cr-tabs (Acetaminophen) .... One tablet every morning, then one tablet every 6 hours as needed 14)  Hydroxyzine Hcl 25 Mg Tabs (Hydroxyzine hcl) .... Two times a day as needed itching 15)  Digoxin 0.125 Mg Tabs (Digoxin) .Marland Kitchen.. 1 by mouth once daily 16)  Flomax 0.4 Mg Xr24h-cap (Tamsulosin hcl) .... Once daily 17)  Lorazepam 0.5 Mg Tabs (Lorazepam) .... One tablet by mouth twice a day as needed for anxiety, agitation or insomnia 18)  Zocor 20 Mg Tabs (Simvastatin) .Marland Kitchen.. 1 daily 19)  Aspirin 81 Mg Tbec (Aspirin) .... Take one tablet by mouth daily 20)  Colace 100 Mg  Caps (Docusate sodium) .... One cap by mouth bid 21)  Miralax Powd (Polyethylene glycol 3350) .Marland KitchenMarland KitchenMarland Kitchen 17 g daily with fluids 22)  Bactrim 400-80 Mg Tabs (Sulfamethoxazole-trimethoprim) .Marland Kitchen.. 1 by mouth once daily  Other Orders: Specimen Handling (44010) T-Culture, Urine ([id number]) T-Urine Microscopic ([id number]) T-Urinalysis ([id number])

## 2010-12-19 NOTE — Miscellaneous (Signed)
Summary: Care Plan/[facility]  Care Plan/[facility]   Imported By: Lanelle Bal 05/09/2010 10:04:18  _____________________________________________________________________  External Attachment:    Type:   Image     Comment:   External Document

## 2010-12-19 NOTE — Miscellaneous (Signed)
Summary: Telephone Order / [facility]  Telephone Order / [facility]   Imported By: Lennie Odor 08/25/2010 11:23:20  _____________________________________________________________________  External Attachment:    Type:   Image     Comment:   External Document

## 2010-12-19 NOTE — Medication Information (Signed)
Summary: Med Profile/[facility]  [facility]   Imported By: Lanelle Bal 06/15/2010 08:23:03  _____________________________________________________________________  External Attachment:    Type:   Image     Comment:   External Document

## 2010-12-19 NOTE — Progress Notes (Signed)
Summary: [name] PT FELL AGAIN  Phone Note Call from Patient Call back at [phone]   Caller: Daughter Summary of Call: Pt daughter left VM that her mother fell again and would like a call back. Return call, pt daughter states that pt fell again trying to help one of her neighbors. Pt fell on her left side and is now c/o of pain in her hip and swelling in her wrist. pt is currently at [facility] ED for evaluation.....................Marland KitchenFelecia Deloach CMA  August 11, 2010 2:25 PM hip wrist left  Follow-up for Phone Call        noted. will have to see her at the office in the near future and see what we can do to prevent more falls Follow-up by: Jose E. Paz MD,  August 14, 2010 11:07 AM

## 2010-12-19 NOTE — Miscellaneous (Signed)
Summary: Med & Treatment Record/[facility]  Med & Treatment Record/[facility]   Imported By: Lanelle Bal 01/03/2010 12:44:24  _____________________________________________________________________  External Attachment:    Type:   Image     Comment:   External Document

## 2010-12-19 NOTE — Progress Notes (Signed)
Summary: REFILL REQUEST  Phone Note Refill Request Call back at [phone] Message from:  Pharmacy on June 26, 2010 9:40 AM  Refills Requested: Medication #1:  FLOMAX 0.4 MG XR24H-CAP once daily   Dosage confirmed as above?Dosage Confirmed   Supply Requested: 1 month [facility] [address] [place] [address]  Next Appointment Scheduled: NONE Initial call taken by: Lavell Islam,  June 26, 2010 9:41 AM  Follow-up for Phone Call        we have never filled before, how many refills? Army Fossa CMA  June 26, 2010 9:49 AM   Additional Follow-up for Phone Call Additional follow up Details #1::        okay to  call  #90 and 3 rf Additional Follow-up by: [facility] E. Paz MD,  June 26, 2010 2:42 PM    Additional Follow-up for Phone Call Additional follow up Details #2::    faxed to [facility] Army Fossa CMA  June 26, 2010 2:43 PM   Prescriptions: FLOMAX 0.4 MG XR24H-CAP (TAMSULOSIN HCL) once daily  #90 x 3   Entered by:   Army Fossa CMA   Authorized by:   Nolon Rod. Paz MD   Signed by:   Army Fossa CMA on 06/26/2010   Method used:   Print then Give to Patient   RxID:   [id number]

## 2010-12-21 NOTE — Miscellaneous (Signed)
Summary: Face to Face Encounter/[facility]  Face to Face Encounter/[facility]   Imported By: Lanelle Bal 11/09/2010 12:44:57  _____________________________________________________________________  External Attachment:    Type:   Image     Comment:   External Document

## 2010-12-21 NOTE — Medication Information (Signed)
Summary: Request for Change in Norco/[facility]  Request for Change in Norco/[facility]   Imported By: Lanelle Bal 11/03/2010 13:13:34  _____________________________________________________________________  External Attachment:    Type:   Image     Comment:   External Document

## 2010-12-21 NOTE — Letter (Signed)
Summary: worsening macular degeneration---ophtalmology  Consult Note/[facility]   Imported By: Lanelle Bal 11/10/2010 13:15:44  _____________________________________________________________________  External Attachment:    Type:   Image     Comment:   External Document

## 2010-12-21 NOTE — Medication Information (Signed)
Summary: Fax Regarding Med Therapy Mgmt/[facility]  Fax Regarding Med Therapy Mgmt/[facility]   Imported By: Lanelle Bal 11/22/2010 10:13:34  _____________________________________________________________________  External Attachment:    Type:   Image     Comment:   External Document

## 2010-12-21 NOTE — Letter (Signed)
Summary: Medical Necessity for Oxygen/[facility]  Medical Necessity for Oxygen/[facility]   Imported By: Maryln Gottron 12/13/2010 14:36:12  _____________________________________________________________________  External Attachment:    Type:   Image     Comment:   External Document

## 2010-12-21 NOTE — Miscellaneous (Signed)
Summary: Face to Face Encounter Form/[facility]  Face to Face Encounter Form/[facility]   Imported By: Lanelle Bal 11/10/2010 13:13:14  _____________________________________________________________________  External Attachment:    Type:   Image     Comment:   External Document

## 2010-12-21 NOTE — Op Note (Signed)
Summary: Cervical Epidural Steroid Injection/[facility]  Cervical Epidural Steroid Injection/[facility]   Imported By: Lanelle Bal 11/16/2010 12:40:44  _____________________________________________________________________  External Attachment:    Type:   Image     Comment:   External Document

## 2010-12-21 NOTE — Miscellaneous (Signed)
Summary: Episode Summary/[facility]  Episode Summary/[facility]   Imported By: Lanelle Bal 11/17/2010 14:50:31  _____________________________________________________________________  External Attachment:    Type:   Image     Comment:   External Document

## 2010-12-21 NOTE — Letter (Signed)
Summary: Medical Necessity for Oxygen  Medical Necessity for Oxygen   Imported By: Maryln Gottron 12/13/2010 14:48:45  _____________________________________________________________________  External Attachment:    Type:   Image     Comment:   External Document

## 2010-12-21 NOTE — Letter (Signed)
Summary: CMN for Oximetry/[facility]  CMN for Oximetry/[facility]   Imported By: Lanelle Bal 11/09/2010 12:59:13  _____________________________________________________________________  External Attachment:    Type:   Image     Comment:   External Document

## 2011-01-04 NOTE — Medication Information (Signed)
Summary: Drug Utilization [facility]  Drug Utilization [facility]   Imported By: Maryln Gottron 12/25/2010 11:16:24  _____________________________________________________________________  External Attachment:    Type:   Image     Comment:   External Document

## 2011-01-16 ENCOUNTER — Encounter: Payer: Self-pay | Admitting: Internal Medicine

## 2011-01-16 ENCOUNTER — Other Ambulatory Visit: Payer: Self-pay | Admitting: Internal Medicine

## 2011-01-16 ENCOUNTER — Ambulatory Visit (INDEPENDENT_AMBULATORY_CARE_PROVIDER_SITE_OTHER): Payer: Medicare Other | Admitting: Internal Medicine

## 2011-01-16 DIAGNOSIS — E785 Hyperlipidemia, unspecified: Secondary | ICD-10-CM

## 2011-01-16 DIAGNOSIS — I509 Heart failure, unspecified: Secondary | ICD-10-CM

## 2011-01-16 DIAGNOSIS — R269 Unspecified abnormalities of gait and mobility: Secondary | ICD-10-CM

## 2011-01-16 DIAGNOSIS — N390 Urinary tract infection, site not specified: Secondary | ICD-10-CM

## 2011-01-16 LAB — LIPID PANEL
Cholesterol: 127 mg/dL (ref 0–200)
LDL Cholesterol: 61 mg/dL (ref 0–99)
Total CHOL/HDL Ratio: 5
VLDL: 39.2 mg/dL (ref 0.0–40.0)

## 2011-01-16 LAB — BASIC METABOLIC PANEL
Calcium: 10.2 mg/dL (ref 8.4–10.5)
Creatinine, Ser: 0.7 mg/dL (ref 0.4–1.2)
GFR: 78.67 mL/min (ref >60.00)
Glucose, Bld: 101 mg/dL — ABNORMAL HIGH (ref 70–99)
Sodium: 138 mEq/L (ref 135–145)

## 2011-01-25 NOTE — Progress Notes (Signed)
Summary: Report of Consultation for Nursing Home  Report of Consultation for Nursing Home   Imported By: Maryln Gottron 01/18/2011 14:11:28  _____________________________________________________________________  External Attachment:    Type:   Image     Comment:   External Document

## 2011-01-25 NOTE — Assessment & Plan Note (Signed)
Summary: rto 3 months/cbs   Vital Signs:  Patient profile:   77 year old female Weight:      143 pounds Pulse rate:   75 / minute Pulse rhythm:   regular BP sitting:   126 / 84  (left arm) Cuff size:   regular  Vitals Entered By: Army Fossa CMA (January 16, 2011 9:14 AM) CC: 3 month f/u- fasting  Comments med list reviewed w/ [facility]'s med list   History of Present Illness:  ROV doing well, pain meds work well for her w/o  excessive drowsines " this is the best she has been in the last 3 years" per  daughter  ROS   no fevers No nausea or vomiting No dysuria No recent falls Dementia is at baseline No lower extremity edema or shortness of breath  She is supposed to get cath  at least 3 times a week but the daughter suspect that she is getting bladder catheterizations less often than that  Current Medications (verified): 1)  Coreg 6.25 Mg  Tabs (Carvedilol) .Marland Kitchen.. 1(1/2 ) By Mouth Two Times A Day 2)  Cozaar 50 Mg Tabs (Losartan Potassium) .... Take 1 Tablet By Mouth Once A Day 3)  Furosemide 40 Mg Tabs (Furosemide) .... Once Daily 4)  Nexium 40 Mg Cpdr (Esomeprazole Magnesium) .... Take 1 Capsule By Mouth Once A Day 5)  Estrace 0.1 Mg/gm Crea (Estradiol) .... Insert 1 Gram Into Vagina Once Every Monday,wed,and Friday 6)  Fosamax 70 Mg Tabs (Alendronate Sodium) .Marland Kitchen.. 1 Tablet By Mouth Once A Week 7)  Zolpidem Tartrate 5 Mg  Tabs (Zolpidem Tartrate) .Marland Kitchen.. 1 By Mouth At Bedtime As Needed. 8)  Amitriptyline Hcl 25 Mg  Tabs (Amitriptyline Hcl) .Marland Kitchen.. 1 By Mouth Qhs 9)  Calcium + D 600 .... Two Times A Day 10)  Nitroglycerin 0.4 Mg Subl (Nitroglycerin) .Marland Kitchen.. 1 Tab Sl Every 5 Min As Needed Chest Pain 11)  Tylenol Arthritis Pain 650 Mg Cr-Tabs (Acetaminophen) .... One Tablet Every Morning, Then One Tablet Every 6 Hours As Needed 12)  Digoxin 0.125 Mg Tabs (Digoxin) .Marland Kitchen.. 1 By Mouth Once Daily 13)  Flomax 0.4 Mg Xr24h-Cap (Tamsulosin Hcl) .... Once Daily 14)  Lorazepam  0.5 Mg Tabs (Lorazepam) .... One Tablet By Mouth Twice A Day As Needed For Anxiety, Agitation or Insomnia 15)  Zocor 20 Mg Tabs (Simvastatin) .Marland Kitchen.. 1 Daily 16)  Aspirin 81 Mg Tbec (Aspirin) .... Take One Tablet By Mouth Daily 17)  Colace 100 Mg Caps (Docusate Sodium) .... One Cap By Mouth Bid 18)  Miralax  Powd (Polyethylene Glycol 3350) .Marland KitchenMarland KitchenMarland Kitchen 17 G Daily With Fluids 19)  Hydrocodone-Acetaminophen 5-325 Mg Tabs (Hydrocodone-Acetaminophen) .Marland Kitchen.. 1 Every 8 Hrs As Needed For Pain. 20)  Promethazine Hcl 12.5 Mg Tabs (Promethazine Hcl) .... 1/2 Tab By Mouth Two Times A Day 21)  Acetaminophen 650 Mg Cr-Tabs (Acetaminophen) .... Q6h As Needed For Pain/fever/ha 22)  Macrodantin 50 Mg Caps (Nitrofurantoin Macrocrystal) .... One By Mouth Daily 23)  I Caps .... Two Times A Day 24)  Restasis 0.05 % Emul (Cyclosporine) .... Two Times A Day 25)  Robitussin Dm 100-10 Mg/84ml Syrp (Dextromethorphan-Guaifenesin) .... As Needed  Allergies (verified): 1)  ! Imdur  Past History:  Past Medical History: Reviewed history from 10/16/2010 and no changes required. Ischemic cardiomyopathy.  This was presumed based on EKG and echocardiogram.  Her EKG shows evidence for an old anterior MI. Most recent echo in 1/11 with EF 30%, apical aneurysm, mild LVH, grade  I diastolic dysfunction, mild MR, no AS.   Mild aspiration per swallow test 1-11 Pulmonary fibrosis--COPD  Patient was on home O2 at one point at night   There is a question of whether her pulmonary fibrosis was triggered as an adverse reaction to Macrodantin. Chronic UTIs, Sepsis- UTI w/ MS changes 10-09 Status post left hip fracture with pinning in September 2009. History of falls. Possible TIAs. GERD Macular degeneration-- L blindness,  R: peripheral vision only Hypercholesterolemia.  DEMENTIA, SENILE, UNCOMPLICATED   Osteoarthritis, chronic neck pain   Past Surgical History: Reviewed history from 08/12/2009 and no changes  required. Cholecystectomy Hysterectomy Oophorectomy Spinal fusion,x 3 ,  lower spine hip fracture 07-2008  Social History: Reviewed history from 12/19/2009 and no changes required. The patient is a resident of  [facility].  widow Daughter Dione Booze lives in [place], smoked for 30 years but quit over 30 years  ago.  Nondrinker.   Has no history of drug abuse.      Physical Exam  General:  alert and well-developed.   Lungs:  normal respiratory effort and no intercostal retractions.   decreased breath sounds, very fine crackles at bases bilaterally but otherwise clear Heart:  s1/s2, + systolic murmur  Extremities:  no edema   Impression & Recommendations:  Problem # 1:  GAIT DISTURBANCE (ICD-781.2)  doing well, no recent falls  Problem # 2:  CONGESTIVE HEART FAILURE (ICD-428.0)  no vol. overload , doing well Her updated medication list for this problem includes:    Coreg 6.25 Mg Tabs (Carvedilol) .Marland Kitchen... 1(1/2 ) by mouth two times a day    Cozaar 50 Mg Tabs (Losartan potassium) .Marland Kitchen... Take 1 tablet by mouth once a day    Furosemide 40 Mg Tabs (Furosemide) ..... Once daily    Digoxin 0.125 Mg Tabs (Digoxin) .Marland Kitchen... 1 by mouth once daily    Aspirin 81 Mg Tbec (Aspirin) .Marland Kitchen... Take one tablet by mouth daily  Orders: Venipuncture (29562) TLB-BMP (Basic Metabolic Panel-BMET) (80048-METABOL) Specimen Handling (13086)  Problem # 3:  HYPERLIPIDEMIA (ICD-272.4) labs  Her updated medication list for this problem includes:    Zocor 20 Mg Tabs (Simvastatin) .Marland Kitchen... 1 daily  Orders: TLB-ALT (SGPT) (84460-ALT) TLB-AST (SGOT) (84450-SGOT) TLB-Lipid Panel (80061-LIPID) Specimen Handling (57846)  Labs Reviewed: SGOT: 20 (12/19/2009)   SGPT: 16 (12/19/2009)   HDL:39.10 (12/19/2009), 27.30 (08/15/2009)  LDL:94 (08/15/2007)  Chol:146 (12/19/2009), 208 (08/15/2009)  Trig:202.0 (12/19/2009), 191.0 (08/15/2009)  Problem # 4:  PULMONARY FIBROSIS (ICD-515)   stable  Problem # 5:  URINARY TRACT INFECTION, RECURRENT (ICD-599.0)  doing very well on Macrodantin. I sent a order for  daily catheterization or at least 3 times a week Her updated medication list for this problem includes:    Macrodantin 50 Mg Caps (Nitrofurantoin macrocrystal) ..... One by mouth daily  Problem # 6:  OSTEOARTHRITIS (ICD-715.90)  tolerates current pain medicine very well Her updated medication list for this problem includes:    Tylenol Arthritis Pain 650 Mg Cr-tabs (Acetaminophen) ..... One tablet every morning, then one tablet every 6 hours as needed    Aspirin 81 Mg Tbec (Aspirin) .Marland Kitchen... Take one tablet by mouth daily    Hydrocodone-acetaminophen 5-325 Mg Tabs (Hydrocodone-acetaminophen) .Marland Kitchen... 1 every 8 hrs as needed for pain.    Acetaminophen 650 Mg Cr-tabs (Acetaminophen) ..... Q6h as needed for pain/fever/ha  Complete Medication List: 1)  Coreg 6.25 Mg Tabs (Carvedilol) .Marland Kitchen.. 1(1/2 ) by mouth two times a day 2)  Cozaar 50 Mg Tabs (Losartan potassium) .... Take 1 tablet by mouth once a day 3)  Furosemide 40 Mg Tabs (Furosemide) .... Once daily 4)  Nexium 40 Mg Cpdr (Esomeprazole magnesium) .... Take 1 capsule by mouth once a day 5)  Estrace 0.1 Mg/gm Crea (Estradiol) .... Insert 1 gram into vagina once every monday,wed,and friday 6)  Fosamax 70 Mg Tabs (Alendronate sodium) .Marland Kitchen.. 1 tablet by mouth once a week 7)  Zolpidem Tartrate 5 Mg Tabs (Zolpidem tartrate) .Marland Kitchen.. 1 by mouth at bedtime as needed. 8)  Amitriptyline Hcl 25 Mg Tabs (Amitriptyline hcl) .Marland Kitchen.. 1 by mouth qhs 9)  Calcium + D 600  .... Two times a day 10)  Nitroglycerin 0.4 Mg Subl (Nitroglycerin) .Marland Kitchen.. 1 tab sl every 5 min as needed chest pain 11)  Tylenol Arthritis Pain 650 Mg Cr-tabs (Acetaminophen) .... One tablet every morning, then one tablet every 6 hours as needed 12)  Digoxin 0.125 Mg Tabs (Digoxin) .Marland Kitchen.. 1 by mouth once daily 13)  Flomax 0.4 Mg Xr24h-cap (Tamsulosin hcl) .... Once daily 14)  Lorazepam  0.5 Mg Tabs (Lorazepam) .... One tablet by mouth twice a day as needed for anxiety, agitation or insomnia 15)  Zocor 20 Mg Tabs (Simvastatin) .Marland Kitchen.. 1 daily 16)  Aspirin 81 Mg Tbec (Aspirin) .... Take one tablet by mouth daily 17)  Colace 100 Mg Caps (Docusate sodium) .... One cap by mouth bid 18)  Miralax Powd (Polyethylene glycol 3350) .Marland KitchenMarland KitchenMarland Kitchen 17 g daily with fluids 19)  Hydrocodone-acetaminophen 5-325 Mg Tabs (Hydrocodone-acetaminophen) .Marland Kitchen.. 1 every 8 hrs as needed for pain. 20)  Promethazine Hcl 12.5 Mg Tabs (Promethazine hcl) .... 1/2 tab by mouth two times a day 21)  Acetaminophen 650 Mg Cr-tabs (Acetaminophen) .... Q6h as needed for pain/fever/ha 22)  Macrodantin 50 Mg Caps (Nitrofurantoin macrocrystal) .... One by mouth daily 23)  I Caps  .... Two times a day 24)  Restasis 0.05 % Emul (Cyclosporine) .... Two times a day 25)  Robitussin Dm 100-10 Mg/38ml Syrp (Dextromethorphan-guaifenesin) .... As needed  Patient Instructions: 1)  Please schedule a follow-up appointment in 3 months .    Orders Added: 1)  Venipuncture [36415] 2)  TLB-BMP (Basic Metabolic Panel-BMET) [80048-METABOL] 3)  TLB-ALT (SGPT) [84460-ALT] 4)  TLB-AST (SGOT) [84450-SGOT] 5)  TLB-Lipid Panel [80061-LIPID] 6)  Specimen Handling [99000] 7)  Est. Patient Level IV [04540]

## 2011-02-01 LAB — CBC
HCT: 40.7 % (ref 36.0–46.0)
Hemoglobin: 13.3 g/dL (ref 12.0–15.0)
RBC: 4.61 MIL/uL (ref 3.87–5.11)
WBC: 11.2 10*3/uL — ABNORMAL HIGH (ref 4.0–10.5)

## 2011-02-01 LAB — BASIC METABOLIC PANEL
Calcium: 10.3 mg/dL (ref 8.4–10.5)
GFR calc Af Amer: >60 mL/min (ref >60)
GFR calc non Af Amer: >60 mL/min (ref >60)
Potassium: 4.5 mEq/L (ref 3.5–5.1)
Sodium: 137 mEq/L (ref 135–145)

## 2011-02-01 LAB — PROTIME-INR
INR: 1.01 (ref 0.00–1.49)
Prothrombin Time: 13.5 seconds (ref 11.6–15.2)

## 2011-02-21 LAB — URINE MICROSCOPIC-ADD ON

## 2011-02-21 LAB — URINE CULTURE: Colony Count: >=100000

## 2011-02-21 LAB — DIFFERENTIAL
Basophils Absolute: 0 10*3/uL (ref 0.0–0.1)
Basophils Relative: 0 % (ref 0–1)
Eosinophils Absolute: 0.2 10*3/uL (ref 0.0–0.7)
Eosinophils Relative: 2 % (ref 0–5)
Monocytes Absolute: 0.7 10*3/uL (ref 0.1–1.0)
Monocytes Relative: 5 % (ref 3–12)
Neutro Abs: 10.2 10*3/uL — ABNORMAL HIGH (ref 1.7–7.7)

## 2011-02-21 LAB — CBC
HCT: 39.9 % (ref 36.0–46.0)
Hemoglobin: 13.3 g/dL (ref 12.0–15.0)
MCHC: 33.4 g/dL (ref 30.0–36.0)
MCV: 87.8 fL (ref 78.0–100.0)
RBC: 4.54 MIL/uL (ref 3.87–5.11)
RDW: 15.9 % — ABNORMAL HIGH (ref 11.5–15.5)

## 2011-02-21 LAB — POCT I-STAT, CHEM 8
Calcium, Ion: 1.28 mmol/L (ref 1.12–1.32)
Chloride: 101 mEq/L (ref 96–112)
Glucose, Bld: 108 mg/dL — ABNORMAL HIGH (ref 70–99)
HCT: 43 % (ref 36.0–46.0)
TCO2: 27 mmol/L (ref 0–100)

## 2011-02-21 LAB — URINALYSIS, ROUTINE W REFLEX MICROSCOPIC
Bilirubin Urine: NEGATIVE
Ketones, ur: NEGATIVE mg/dL
Nitrite: POSITIVE — AB
Protein, ur: NEGATIVE mg/dL
pH: 5.5 (ref 5.0–8.0)

## 2011-02-28 LAB — URINALYSIS, ROUTINE W REFLEX MICROSCOPIC
Ketones, ur: NEGATIVE mg/dL
Nitrite: NEGATIVE
Protein, ur: NEGATIVE mg/dL
Urobilinogen, UA: 0.2 mg/dL (ref 0.0–1.0)

## 2011-02-28 LAB — POCT I-STAT, CHEM 8
BUN: 15 mg/dL (ref 6–23)
Calcium, Ion: 1.26 mmol/L (ref 1.12–1.32)
Chloride: 102 mEq/L (ref 96–112)
Creatinine, Ser: 1 mg/dL (ref 0.4–1.2)
Glucose, Bld: 104 mg/dL — ABNORMAL HIGH (ref 70–99)

## 2011-02-28 LAB — BASIC METABOLIC PANEL
BUN: 9 mg/dL (ref 6–23)
CO2: 24 mEq/L (ref 19–32)
Calcium: 9.9 mg/dL (ref 8.4–10.5)
Calcium: 9.9 mg/dL (ref 8.4–10.5)
Creatinine, Ser: 0.6 mg/dL (ref 0.4–1.2)
Creatinine, Ser: 0.79 mg/dL (ref 0.4–1.2)
GFR calc Af Amer: >60 mL/min (ref >60)
GFR calc non Af Amer: >60 mL/min (ref >60)
Glucose, Bld: 102 mg/dL — ABNORMAL HIGH (ref 70–99)
Glucose, Bld: 89 mg/dL (ref 70–99)
Sodium: 131 mEq/L — ABNORMAL LOW (ref 135–145)

## 2011-02-28 LAB — DIFFERENTIAL
Basophils Absolute: 0 10*3/uL (ref 0.0–0.1)
Basophils Relative: 0 % (ref 0–1)
Eosinophils Absolute: 0.1 10*3/uL (ref 0.0–0.7)
Neutrophils Relative %: 82 % — ABNORMAL HIGH (ref 43–77)

## 2011-02-28 LAB — URINE MICROSCOPIC-ADD ON

## 2011-02-28 LAB — DIGOXIN LEVEL: Digoxin Level: 0.5 ng/mL — ABNORMAL LOW (ref 0.8–2.0)

## 2011-02-28 LAB — CULTURE, BLOOD (ROUTINE X 2)

## 2011-02-28 LAB — CBC
MCHC: 34 g/dL (ref 30.0–36.0)
MCHC: 34.1 g/dL (ref 30.0–36.0)
MCV: 88.5 fL (ref 78.0–100.0)
MCV: 89 fL (ref 78.0–100.0)
Platelets: 231 10*3/uL (ref 150–400)
RBC: 3.96 MIL/uL (ref 3.87–5.11)
WBC: 13.1 10*3/uL — ABNORMAL HIGH (ref 4.0–10.5)

## 2011-02-28 LAB — URINE CULTURE

## 2011-03-07 ENCOUNTER — Other Ambulatory Visit: Payer: Self-pay | Admitting: *Deleted

## 2011-03-07 MED ORDER — LORAZEPAM 0.5 MG PO TABS: ORAL_TABLET | ORAL | Status: DC

## 2011-03-07 NOTE — Telephone Encounter (Signed)
Ok 40, no RF 

## 2011-03-16 ENCOUNTER — Telehealth: Payer: Self-pay | Admitting: *Deleted

## 2011-03-16 NOTE — Telephone Encounter (Signed)
Per Dr.Paz pt needs to go to the ER for evaluation, UTI? Or other infection? I called April at [facility] she is aware of Dr.Paz's recommendations she will call the pt's daughter and let her know.

## 2011-03-16 NOTE — Telephone Encounter (Signed)
I spoke w/ [facility], April. They state her behavior is not any better. She is still arguing with the staff a lot.

## 2011-04-03 NOTE — Assessment & Plan Note (Signed)
[facility]                            CARDIOLOGY OFFICE NOTE   NAME:Marcell, NANCYLEE GAINES                          MRN:          [id number]  DATE:05/01/2007                            DOB:          03-06-1920    REASON FOR VISIT:  Follow up cardiomyopathy.   HISTORY OF PRESENT ILLNESS:  Ms. Hailu returns for a six month follow up.  She has an ischemic cardiomyopathy with an ejection fraction of 25-35%,  also complicated by pulmonary fibrosis and has been managed  conservatively with medical therapy. She does not report any major  change in her baseline dyspnea on exertion which is NYHA class 2-3. She  has occasional chest pain, but nothing frequent. Her electrocardiogram  today shows an ectopic atrial rhythm at 52 beats-per-minute which is new  with similar STT wave changes as noted previously, consistent with  anterior infarct in the past. I reviewed her medications and we talked  about decreasing her Digoxin to every other day dosing. Otherwise, she  continues to struggle with arthritic pain in her neck and takes Darvocet  for this at home.   ALLERGIES:  IMDUR.   CURRENT MEDICATIONS:  1. Cozaar 50 mg daily.  2. Sulindac 200 mg b.i.d.  3. Lasix 40 mg daily.  4. Digoxin 0.125 mg daily.  5. Coreg 6.25 mg b.i.d.  6. Amitriptyline 25 mg daily.  7. Lovastatin 10 mg daily.  8. Nitro-Dur 0.3 mg patch.  9. Fosamax 70 mg weekly.  10.Nexium 40 mg daily.  11.Esterase cream.  12.Zolpidem 5 mg daily.  13.Darvocet N100.   REVIEW OF SYSTEMS:  As described in the history of present illness.  Otherwise nedgative.   PHYSICAL EXAMINATION:  VITAL SIGNS:  Blood pressure 118/70, heart rate  58, weight 130 pounds.  GENERAL:  The patient is comfortable and in no acute distress.  HEENT:  Conjunctivae is normal. Oropharynx is clear.  NECK:  Supple. No elevated jugular venous pressure or loud bruits. No  thyromegaly is noted.  LUNGS:  Exhibit coarse breath sounds, no  wheezing noted.  CARDIAC:  Reveals a regular rate and rhythm, no S3 gallop.  ABDOMEN:  Soft and nontender, normal active bowel sounds.  EXTREMITIES:  Exhibit no pitting edema.  SKIN:  Warm and dry.   IMPRESSION/RECOMMENDATIONS:  1. Ischemic cardiomyopathy with an ejection fraction of 25-35%. I will      plan to continue her present regimen except to decrease Digoxin to      every other day dosing given her ectopic bradycardia. She has had a      creatinine of 1.2 in the past. Otherwise, I will plan to follow up      with me over the next 3-4 months. Prescriptions were provided for      Coreg, Lasix, Digoxin, Cozaar, Lovastatin and NitroDerm.  2. Further plan is to follow up.     [name], MD  Electronically Signed    SGM/MedQ  DD: 05/01/2007  DT: 05/02/2007  Job #: [id number]

## 2011-04-03 NOTE — Consult Note (Signed)
NAMEANNI, HOCEVAR                   ACCOUNT NO.:  [id number]   MEDICAL RECORD NO.:  [id number]          PATIENT TYPE:  INP   LOCATION:  1402                         [facility]ACILITY[facility]  [facility]   PHYSICIAN:  Duke Salvia, MD, FACCDATE OF BIRTH:  26-Apr-1920   DATE OF CONSULTATION:  DATE OF DISCHARGE:                                 CONSULTATION   Thank you very much for asking [place] to see Ms. Bowley in preoperative  consultation for hip reduction surgery.   [name] is an 77 year old woman with known longstanding ischemic and  nonischemic cardiomyopathy without catheterization, but multiple wall  motion abnormalities and ejection fraction of 25-30%.  She has  compensated functional limitations.  It has not been clear, however,  whether her functional limitations are related primarily to her heart or  more so to her oxygen-dependent pulmonary fibrosis which is  longstanding.   She is limited to less than probably about 50 feet.  She had some  peripheral edema and some orthopnea.   Her related medical history ties in to her fall.  She has issues with  urinary retention.  She was put on bethanechol by Dr. Vonita Moss.  When  she got home that day, she ended up having some problem with diarrhea  and fell on trying to get to the bathroom and fractured her hip.   PAST MEDICAL HISTORY:  In addition to the above is notable for recurrent  transient neurological episodes including slurred speech and a loss of  orientation while driving a couple of years ago.  Macular degeneration  and osteoarthritis.   Her past surgical history is notable for cholecystectomy, left shoulder  surgery, and hysterectomy with BSO.   SOCIAL HISTORY:  She lives by herself.  She has 3 children.  She does  not smoke or use alcohol.   FAMILY HISTORY:  Noncontributory.   MEDICATIONS:  Prior to arrival,  1. Digoxin 0.125.  2. Carvedilol 6.25 b.i.d.  3. Cozaar 50.  4. Furosemide 40.  5. Nitro-Dur 0.3 q.24 hours.  6.  Lovastatin 10.  7. Nexium 40.  8. Estrace.  9. Sulindac.  10.Fosamax.  11.Amitriptyline.  12.Recently added bethanechol.   ALLERGIES:  She is intolerant to IMDUR.   PHYSICAL EXAMINATION:  GENERAL:  She is an elderly Caucasian female who  appears in her stated age of 77.  VITAL SIGNS:  Her blood pressure is 109/36 with a pulse of 77.  HEENT:  No icterus or xanthoma.  NECK:  Veins are flat.  Carotids are brisk and full bilaterally without  bruits.  BACK:  Without kyphosis or scoliosis.  LUNGS:  Clear.  HEART:  Sounds were regular with a sustained PMI.  ABDOMEN:  Soft with active bowel sounds without midline pulsation or  hepatomegaly.  EXTREMITIES:  Femoral pulses were 2+.  Distal pulses were.  There is no  clubbing, cyanosis, or edema.  The left leg was in traction.   Electrocardiogram dated today demonstrated sinus rhythm at 75 with  intervals of 0.17/0.11/0.40.  The axis was mildly leftward at -25.  There was poor R-wave  progression.  There are diffuse anterolateral Q-  waves which are unchanged from a couple of years ago.   LABORATORY DATA:  Notable for a white count of 31,000.  Sodium of 130.  Normal renal function.   IMPRESSION:  1. Nonischemic and ischemic cardiomyopathy with,      a.     Ejection fraction of 25-35%.      b.     No catheterization for multiple wall motion abnormalities       and ECG changes.  2. Oxygen-dependent pulmonary fibrosis.  3. Transient neurological events as noted above.  4. Fall with a hip fracture.  5. Bladder issues, chronic urinary tract infections given rise to      bethanechol and the above fall.  6. Leukocytosis.   From a cardiac point-of-view, Ms. Roscher's surgery is of intermediate risk  in the setting of chronic congestive heart failure.  Given the  alternatives, I think proceeding with surgery with attention to fluid  status is reasonable.   Given her concomitant lung problems, I have asked Pulmonary to offer  there thoughts  as to how best to  support her during her and after her  surgery.   We would like to follow along.      Duke Salvia, MD, [facility]  Electronically Signed     SCK/MEDQ  D:  08/01/2008  T:  08/02/2008  Job:  [id number]

## 2011-04-03 NOTE — Assessment & Plan Note (Signed)
 [facility]                            CARDIOLOGY OFFICE NOTE   NAME:Fisher, Brianna PRISK                          MRN:          [id number]  DATE:03/01/2008                            DOB:          17-Sep-1920    PRIMARY CARE PHYSICIAN:  Dr. Willow Ora.   REASON FOR VISIT:  Follow-up cardiomyopathy.   HISTORY OF PRESENT ILLNESS:  [name] comes back in for a routine visit.  She is overall relatively stable.  She has both significant pulmonary  fibrosis on home oxygen, as well as an ischemic cardiomyopathy with an  ejection fraction of 25-35%.  Her electrocardiogram today is overall  stable, showing evidence of previous anterior wall infarction with  aneurysmal change.  She has NYHA class II-III dyspnea on exertion and  fatigue, which has been chronic.  She had no major change in her weight,  although it is up a few pounds since last visit.  She did not want to  decrease her digoxin to every other day and has preferred to keep her  medical regiment stable.   ALLERGIES:  IMDUR.   PRESENT MEDICATIONS:  A.  Aspirin 325 mg p.o. daily.  1. Cozaar 50 mg p.o. daily.  2. Sulindac 200 mg p.o. b.i.d.  3. Lasix 40 mg p.o. daily.  4. Digoxin 0.125 mg p.o. daily  5. Coreg 6.25 mg p.o. b.i.d.  6. Amitriptyline 25 mg p.o. nightly.  7. Lovastatin 10 mg p.o. nightly.  8. Nitro-Dur 0.3 mg q.12 h.  9. Fosamax 70 mg weekly.  10.Nexium 40 mg p.o. daily.  11.Estrace 0.01% cream.  12.Zolpidem 5 mg p.o. nightly.  13.Lidoderm patch.   REVIEW OF SYSTEMS:  As described in history of present illness,  otherwise negative.   PHYSICAL EXAMINATION:  VITAL SIGNS:  Blood pressure is 110/64, heart  rate 69.  Weight is 134 pounds.  GENERAL:  The patient is comfortable and in no acute distress.  NECK:  Reveals no elevated jugulovenous pressure.  No loud bruits.  LUNGS:  With diffuse coarse breath sounds throughout.  No wheezing or  labored breathing.  HEART:  Reveals a regular rate  and rhythm.  Soft systolic murmur.  No S3  gallop.  PMI is indistinct.  ABDOMEN:  Soft, nontender.  EXTREMITIES:  Exhibit no frank pitting edema.   IMPRESSION/RECOMMENDATIONS:  Ischemic cardiomyopathy with ejection  fraction of 25-35%.  Our plan is conservative medical therapy in the  setting of severe pulmonary fibrosis.  The patient has been  symptomatically stable with New York Heart Association class II-III  dyspnea on exertion.  No frank orthopnea and no anginal chest pain.  I  will plan to see her back over the next 4 months.     [name], MD  Electronically Signed    SGM/MedQ  DD: 03/01/2008  DT: 03/01/2008  Job #: [id number]   cc:   Willow Ora, MD

## 2011-04-03 NOTE — Discharge Summary (Signed)
NAMEIJEOMA, LOOR                   ACCOUNT NO.:  [id number]   MEDICAL RECORD NO.:  [id number]          PATIENT TYPE:  INP   LOCATION:  1304                         [facility]ACILITY:  [facility]   PHYSICIAN:  Rosalyn Gess. Norins, MD  DATE OF BIRTH:  Apr 22, 1920   DATE OF ADMISSION:  02/25/2009  DATE OF DISCHARGE:  03/03/2009                               DISCHARGE SUMMARY   ADMITTING DIAGNOSES:  1. Inability to adequately walk and manage after hip fracture on the      left with a need for skilled care.  2. Urinary tract infection, recurrent.  3. Hyperlipidemia.  4. Chronic obstructive pulmonary disease.   DISCHARGE DIAGNOSES:  1. Hip pain, ready for rehab.  2. Urinary tract infection that was E. coli that was multi resistant      to ciprofloxacin, trimethoprim-sulfamethoxazole but was sensitive      to ceftriaxone.  3. Hyperlipidemia.  4. Chronic obstructive pulmonary disease.   CONSULTANTS:  None.   PROCEDURES:  1. Bilateral hip films at admission, April 9, which showed      postoperative changes of left hip and symphysis pubis.  There was      no evidence of acute fracture or dislocation.  2. CT of the head without contrast day of admission which showed no CT      evidence of acute intracranial abnormality.  Significant atrophy      and changes of small vessel disease was noted.  Small air fluid      level within the right sphenoid air cell was noted.  3. CT cervical spine which showed significant degenerative change      throughout the cervical spine but no evidence of acute abnormality.   HISTORY OF PRESENT ILLNESS:  The patient is a delightful 77 year old  woman who is status post hip fracture and operative repair and internal  fixation.  The patient left rehabilitation prematurely and developed  significant difficulty with ambulation.  For the past several weeks she  continued to do worse with greater difficulty in ambulation.  The  patient did have a fall on the Monday prior to  admission with no pain  afterwards but she has had weakness.  The patient also has a history of  recurrent UTI, but denied at admission any dysuria or frequency.   Please see the EMR H and P for past medical history, family history,  social history and physical exam at admission.   HOSPITAL COURSE:  1. Hip pain and fall.  The patient is status post hip fracture and      operative repair.  She clearly is in need for ongoing rehab for      strengthening and balance.  The patient has done well while in      hospital but has not actually participated in physical therapy.      She did have a PT/OT evaluation who felt that she did need to have      ongoing short-term skilled care.  The patient has had a bed offer      from [facility] which she is  satisfied with and today is ready for      discharge.  2. UTI.  The patient did have a recurrent UTI with greater than      100,000 colonies of E. coli.  The patient was initially started on      Cipro but she had a poor response.  She reports she had been      treated as an outpatient with Macrodantin which is contraindicated      because of her age.  The patient was switched to trimethoprim-      sulfamethoxazole pending culture results.  Culture results did      indicate she had E. coli that was resistant to Cipro, trimethoprim-      sulfamethoxazole and ampicillin but was sensitive to ceftriaxone.      The patient was  subsequently switched to Ceftin and has done well      on that regimen.  3. Hyperlipidemia.  The patient's lipids were adequately controlled.  4. Hyponatremia.  The patient was given normal saline IV with adequate      replacement of her sodium.  5. COPD.  The patient was O2 dependent at home, continued on oxygen in      the hospital and had no acute exacerbations of her condition.  With      the patient being medically stable with a bed available for skilled      care rehab she is now felt to be ready for discharge home.    DISCHARGE EXAMINATION:  VITAL SIGNS:  Temperature was 98.4, blood  pressure 129/68, heart rate 78, respirations 20.  It is listed that the  patient had oxygen saturation of 95% on room air.  GENERAL APPEARANCE:  This is a pleasant elderly woman in no acute  distress sitting on the side of the bed.  HEENT:  Normocephalic, atraumatic.  NECK:  Supple with full range of motion actively.  CHEST:  The patient is moving air well with decreased breath sounds.  No  wheezing or rhonchi are noted.  She had no increased work of breathing.  CARDIOVASCULAR:  2+ radial pulse.  Her precordium was quiet.  She had a  regular rate and rhythm.  BREASTS:  Exam deferred.  ABDOMEN:  Abdomen was nontender to palpation in the sitting position.  PELVIC/RECTAL:  Exams deferred.  EXTREMITIES:  Without clubbing, cyanosis, edema was noted.  NEUROLOGICAL:  Revealed the patient to be awake, alert, oriented to  person, place, time and context.  Cranial nerves were intact.  Motor  strength appeared to be normal.  The patient was not ambulated.   FINAL LABORATORY:  Basic metabolic panel day of discharge with a sodium  of 131, potassium 3.7, chloride 97, CO2 24, BUN of 9, creatinine 0.6,  glucose was 89.  Final urine culture result as noted with greater than  100,000 E. coli that was resistant to ampicillin, sensitive to  cefazolin, ceftriaxone, resistant to ciprofloxacin, sensitive to  gentamicin, resistant to levofloxacin, sensitive to nitrofurantoin,  tobramycin, resistant to trimethoprim-sulfamethoxazole.  Dig level on  April 9 was subtherapeutic at 0.5.  CBC at admission with a hemoglobin  of 13.1 grams, her white count was 13,100 at that time.   DISCHARGE MEDICATIONS:  1. Aspirin 325 mg daily.  2. Ocuvite tablets daily.  3. Protonix 40 mg p.o. daily.  4. Clinoril 200 mg b.i.d.  5. Fosamax 70 mg once weekly.  6. Cozaar 50 mg daily.  7. Digoxin 0.125 mg daily.  8.  Zolpidem 5 mg q.h.s.  9. Amitriptyline 25 mg  q.h.s.  10.Furosemide 40 mg daily.  11.Carvedilol 6.25 mg b.i.d.  12.Estradiol 0.01% cream vaginally Monday, Wednesday, Friday.  13.Atrovent 2 puffs q.i.d.  14.Nitroglycerin 0.3 mg per hour patch applied in the a.m. and off in      the p.m. for 12 hours on and 12 hours off.  15.Ceftin 250 mg b.i.d. for an additional 5 days.  16.Tylenol 650 mg q.4 p.r.n.  17.Promethazine 12.5 mg p.o. q.6 p.r.n.   CODE STATUS:  By the patient's wishes she is considered a DNR for no  cardiac resuscitation or mechanical ventilatory support.  Out of  facility order is on the chart to accompany the patient to the facility.   DISCHARGE CONDITION:  The patient's condition at time of discharge  dictation is stable and improved.      Rosalyn Gess Norins, MD  Electronically Signed     MEN/MEDQ  D:  03/03/2009  T:  03/03/2009  Job:  [id number]

## 2011-04-03 NOTE — Consult Note (Signed)
[name], [name]                   ACCOUNT NO.:  [id number]   MEDICAL RECORD NO.:  [id number]          PATIENT TYPE:  INP   LOCATION:  1402                         FACILITY:  [facility]   PHYSICIAN:  Casimiro Needle B. Sherene Sires, MD, FCCPDATE OF BIRTH:  01-26-1920   DATE OF CONSULTATION:  08/02/2008  DATE OF DISCHARGE:                                 CONSULTATION   REASON FOR CONSULTATION:  Preop pulmonary evaluation for pulmonary  fibrosis.   HISTORY:  This is an 77 year old white female chronically oxygen  dependent who carries a diagnosis of COPD with pulmonary fibrosis seen  last in the pulmonary clinic in February 2005 with a concern that she  had been exposed to Macrodantin with a strong recommendation that she  avoid it.  She does not recall this conversation and at that time I made  the recommendation I noted that she was confused with who her doctors  were and what her medications were.  I expressed a concern to her and  her family regarding this issue, and she did not return to the pulmonary  clinic thereafter.   I am seeing her now after hospitalization on September 13 for an acute  left hip fracture for consideration for left hip surgery.  She denies  any recent increasing dyspnea over baseline.  In fact she tells me she  is more limited by her back pain than she is by dyspnea but typically  uses handicapped parking and only walks 25 feet at most before she  gives out.  She also uses oxygen during the day and every night.  Interestingly, she can lie flat at night comfortably on oxygen, but also  is comfortable here lying flat with no desaturation on room air.  She  denies any significant cough, fevers, chills, sweats, pleuritic pain,  orthopnea, PND or leg swelling.   PAST HISTORY:  1. CHF felt to be due to ischemic cardiomyopathy.  2. COPD/pulmonary fibrosis.  3. DJD.  4. Chronic recurrent urinary tract infections requiring multiple doses      of antibiotics (apparently she received  another course of      Macrodantin in July 2009). 5.  History of multiple mini-strokes.  5. Chronic neck pain.  6. Macular degeneration.  7. Clinical acid reflux disease.   SURGICAL HISTORY:  She is status post cholecystectomy, hysterectomy,  oophorectomy, and spinal fusion x3 as well as lower spinal surgery.   MEDICATIONS:  On admission her medications were listed as including  digoxin, Coreg, Cozaar, furosemide, Nitro-Dur, lovastatin, Nexium,  Estrace, sulindac, Fosamax, Ambien, amitriptyline, aspirin, Atrovent and  Detrol.   ALLERGIES:  IMDUR nonspecific in nature.   FAMILY HISTORY:  Positive for heart disease but not lung disease or  atopy, or asthma.   SOCIAL HISTORY:  She is divorced, lives alone.  Daughters visit her  frequently during the day, however.  Generally limited more by her  vision and back than by her breathing in terms of ADLs.  She quit  smoking in 1955.   REVIEW OF SYSTEMS:  Taken in as much detail as possible, also  from the  H&P.  There has been urinary incontinence and chronic back pain but no  other significant new complaints.   PHYSICAL EXAMINATION:  CONSTITUTIONAL:  This is a debilitated white  female who is inappropriately jovial at times, but is lying flat and  comfortable at rest on room air.  HEENT:  Remarkable for the fact she has upper and lower dentures in  place.  Oropharynx otherwise clear.  NECK:  Supple without cervical adenopathy or tenderness.  Trachea is  midline.  CHEST:  Mildly barrel-shaped with crackles on inspiration diffusely,  which were distant in nature.  CARDIOVASCULAR:  There is a regular rate and rhythm with 1/6 systolic  murmur.  There is negative Hoover sign with adequate inspiratory effort  and normal diaphragm contraction.  ABDOMEN:  Otherwise soft, benign and unremarkable.  EXTREMITIES:  Warm without calf tenderness, cyanosis, clubbing or edema.   LAB DATA:  WBC is 23,000 with a hematocrit of 39%.  BMET reveals  sodium  of 129.  Bicarb level is 25.  Saturation is 96% on room air.  Albumin is  3.5.  Urinalysis reveals system few WBCs.  Chest x-ray suggests slight  increase in interstitial markings diffusely, but no real no real  significant overall change in pattern.   IMPRESSION:  The indolent nature of this fibrotic pattern dating back  over 5 years now strongly suggests a possibility of low grade  Macrodantin toxicity.  Other forms of pulmonary fibrosis tend to be more  progressive.  The problem is that I identified Macrodantin as a major  concern 4 years ago and discussed this with her and her family and  because her care was fragmented between different Communities (some of  her care at that point was in [facility] and some of it was here), and  different physicians, this word did not go forward and I will make every  effort possible to make sure that this does not continue to happen in  the future.  In addition, in the elderly we are always concerned about  aspiration as being a mechanism driving fibrosis, and I do agree with  continuing Nexium before breakfast indefinitely.   In terms of her immediate operability, she certainly has some increased  risk of a general geriatric deterioration postoperatively with altered  mental status/hospital-acquired delirium, congestive heart failure,  respiratory failure secondary to both underlying fibrosis plus also  tendency atelectasis and hospital-acquired pneumonia, as well as DVT  pulmonary embolism from immobility.  None of these risks, however, is  prohibitive, and therefore I see no contraindication to proceeding with  general anesthesia if necessary to repair the left hip.   I discussed the risks, benefits, and alternative the patient quite  bluntly.  I told her that she did not have the hip fixed, she would not  be able to walk again, and this would lead to a nursing home.  In the  event that she does have her hip repaired, she will probably  need short  term nursing home placement for rehabilitation, but her lungs should not  be a limiting problem as they have not been so in the past.      Casimiro Needle B. Sherene Sires, MD, [facility]  Electronically Signed     MBW/MEDQ  D:  08/02/2008  T:  08/03/2008  Job:  [id number]   cc:   Willow Ora, MD  [phone] [address] [place], [place] [address]   Ollen Gross, M.D.  Fax: [phone]

## 2011-04-03 NOTE — Discharge Summary (Signed)
NAMESHELIA, KINGSBERRY                   ACCOUNT NO.:  [id number]   MEDICAL RECORD NO.:  [id number]          PATIENT TYPE:  INP   LOCATION:  1402                         FACILITY:  [facility]   PHYSICIAN:  Valerie A. Felicity Coyer, MDDATE OF BIRTH:  August 24, 1920   DATE OF ADMISSION:  08/01/2008  DATE OF DISCHARGE:  08/05/2008                               DISCHARGE SUMMARY   DISCHARGE DIAGNOSES:  1. Nondisplaced fracture of left femoral neck status post left hip      pinning on August 02, 2008.  2. E. coli urinary tract infection.  3. Marked leukocytosis on admission.  4. Mild hyponatremia.  5. O2 dependent pulmonary fibrosis.   HISTORY OF PRESENT ILLNESS:  Ms. Warr is a very pleasant 77 year old  white female with past medical history of congestive heart failure  secondary to ischemic cardiomyopathy, COPD with pulmonary fibrosis and  recurrent urinary tract infection.  The patient presented to [facility]  emergency room on day of admission after falling at home.  Per patient's  report she began taking bethanechol per Dr. Vonita Moss just prior to this  admission.  The patient states the medication caused dizziness and  diarrhea at which time she was attempting to get up and suffered a fall.  Plain film done in the emergency room revealed no acute findings.  However, due to the patient's pain and physical exam the patient did  undergo CT scan revealing nondisplaced fracture of the left femoral  neck.  The patient was admitted at that time for further evaluation and  treatment.   PAST MEDICAL HISTORY:  1. Chronic systolic heart failure secondary to ischemic      cardiomyopathy.  2. COPD with O2 dependent pulmonary fibrosis.  3. Degenerative joint disease.  4. Recurrent urinary tract infection.  5. History of TIAs.  6. Chronic neck pain.  7. Macular degeneration.  8. GERD.  9. Status post cholecystectomy.  10.Status post hysterectomy.  11.Spinal fusion x3 in lumbar spine.   HOSPITAL  CONSULTATIONS:  1. [facility].  2. [facility].  3. Dr. Lequita Halt, orthopedics.   HOSPITAL COURSE:  1. Left hip fracture.  Again, orthopedics was asked to see the patient      in consultation while in the emergency room.  The patient requested      surgery be done by Dr. Lequita Halt as he had seen the patient in the      past.  Due to the patient's history of ischemic cardiomyopathy with      ejection fraction of 25-30% cardiology was asked to see the patient      in consultation who cleared the patient for surgery.  Cardiology      also felt it best to call pulmonary for preop consultation in      setting of the patient's severe pulmonary fibrosis.  The patient      was felt stable for surgery on August 02, 2008.  The patient had      remained euvolemic throughout hospitalization with stable      respiratory status.  The patient to continue medications as  listed      below at time of discharge.  Per orthopedic recommendations the      patient is to be weightbearing as tolerated.  Prior to this      admission the patient was living at home alone.  However, due to      current situation the patient is agreeable to short-term skilled      nursing facility stay for continued physical occupational therapy.  2. E. coli UTI with history of recurrent UTIs.  A urine culture and      sensitivity obtained revealed greater than 100,000 colonies of E.      coli sensitive to Rocephin.  The patient will be treated with a      total of 7 days of therapy to be continued at time of discharge.      In regards to the patient's history of bladder issues bethanechol      was discontinued at time of admission.  The patient and daughter      instructed that the patient needs to follow up with Dr. Vonita Moss      upon discharge from hospital to determine best line of treatment.  3. Leukocytosis.  Upon admission the patient with white cell count      23.1 down to 15.7 at time of dictation likely  secondary to problem      #1 and E. coli UTI.  Would recommend followup CBC when antibiotic      therapy complete.   DISCHARGE MEDICATIONS:  1. Digoxin 0.125 mg p.o. daily.  2. Coreg 6.25 mg p.o. b.i.d.  3. Cozaar 50 mg p.o. daily.  4. Lovastatin 10 mg p.o. daily.  5. Nitro-Dur 0.3 mg per hour patch, change daily.  6. Lasix 40 mg p.o. daily.  7. Estrace 0.01% cream apply Monday, Wednesday and Friday.  8. Sulindac 200 mg p.o. b.i.d.  9. Atrovent 17 mcg inhaler 2 puffs inhaled four times daily p.r.n.  10.Zolpidem 5 mg p.o. q.h.s. p.r.n. sleep.  11.Amitriptyline 25 mg p.o. q.h.s.  12.Fosamax 70 mg p.o. weekly.  13.Nexium 40 mg p.o. daily.  14.Robaxin 500 mg one tablet p.o. q.6 hours p.r.n. muscle spasm.  15.Hydrocodone/APAP 5/325 one tablet p.o. q.4 hours p.r.n. pain.  16.Ceftin 250 mg p.o. b.i.d. until September 19 a.m. then stop.  17.Tylenol 650 mg p.o. q.4 hours p.r.n. pain or fever.  18.Colace 100 mg p.o. b.i.d. while on pain medications.   DISCHARGE PHYSICAL EXAMINATION:  VITAL SIGNS:  Blood pressure 126/67,  heart rate 67, respirations 20, temperature 98.7, O2 sat 97% on room  air.  GENERAL:  This is an elderly white female awake and alert in no acute  distress.  HEENT:  Head is normocephalic, atraumatic.  Pupils equal, round,  reactive to light with no scleral icterus.  Ears, nose and throat mucous  membranes are moist.  The patient with normal hearing.  NECK:  Neck is supple with no thyromegaly or lymphadenopathy.  CHEST:  With symmetrical movement, nontender to palpation.  CARDIOVASCULAR:  S1-S2 regular rate and rhythm.  No lower extremity  edema.  RESPIRATORY:  The patient with decreased respiratory effort, however,  lung sounds are clear to auscultation bilaterally.  No wheezes, rales or  crackles.  No increased work of breathing.  GI:  Abdomen soft, nontender, nondistended.  Bowel sounds positive.  No  appreciated masses or hepatosplenomegaly.  SKIN:  Left hip  surgical incision healing within normal limits without  any signs or symptoms of infection.  NEUROLOGICAL:  The patient moves all extremities x4 with no focal motor  or sensory deficits on exam.  PSYCHIATRIC:  The patient is alert and oriented x3 with pleasant affect.   PERTINENT LAB WORK AT DISCHARGE:  White cell count 15.7, platelet count  210, hemoglobin 12.0, hematocrit 35.6, sodium 133, potassium 3.9, BUN  17, creatinine 0.61.  Urine culture greater than 1000 colonies of E.  coli sensitive to Rocephin   DISPOSITION:  The patient felt medically stable for discharge to skilled  nursing facility at this time where she will undergo short stay in hopes  of returning home where she lives by herself.  The patient can follow up  with her primary care physician, Dr. Willow Ora, as needed upon discharge  from skilled nursing facility.  The patient and daughter instructed that  followup with Dr. Vonita Moss be made as soon as possible to determine  treatment for current bladder issues.  Greater than 30 minutes was spent  on discharge planning.      Cordelia Pen, NP      Raenette Rover. Felicity Coyer, MD  Electronically Signed    LE/MEDQ  D:  08/05/2008  T:  08/05/2008  Job:  [id number]   cc:   Willow Ora, MD  [phone] [place], [place] [address]   Ollen Gross, M.D.  Fax: [phone]

## 2011-04-03 NOTE — Assessment & Plan Note (Signed)
[facility]                            CARDIOLOGY OFFICE NOTE   NAME:[name], [name]                          MRN:          [id number]  DATE:12/22/2008                            DOB:          [birth year]    PRIMARY CARE PHYSICIAN:  Willow Ora, MD   HISTORY OF PRESENT ILLNESS:  She is an 77 year old with a history of  pulmonary fibrosis on home oxygen and ischemic cardiomyopathy who  presents to Cardiology Clinic for followup.  Since her last appointment  here in Cardiology Clinic, the patient did have a fall with a left hip  fracture, and she had pinning of her left hip in September 2009.  Since  that time, she has recovered well.  She is now walking with a walker.  She still lives by herself, but has a caregiver 5 days a week who comes  in to help her out.  She does have a history of a presumed ischemic  cardiomyopathy.  She has multiple wall motion abnormalities on  echocardiogram.  She actually never had a left heart catheterization.  She has a chronic stable pattern of chest pain.  This has been going on  for many years.  She says she tends to get substernal chest pain towards  the evening, it happens maybe once a week.  Usually, the episodes are  prolonged, they can be either at rest or with exertion.  There is no  clear pattern of these episodes.  As mentioned, she has been living with  these for years.  She uses nitroglycerin patches.  She does have stable  dyspnea on exertion after walking about a half a block to a full block.  She has 5 steps up to her front door which she can climb with no  problem.  She has no orthopnea, no PND.  She has had no episodes of  syncope or presyncope.   PAST MEDICAL HISTORY:  1. Ischemic cardiomyopathy.  This was presumed based on EKG and      echocardiogram.  Her EKG shows evidence for an old anterior MI.      Her most recent echo was done in February 2005.  EF was 25-35%.      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  The mid  apical septum, the mid to apical      inferior wall, and mid to apical anterior wall were akinetic.      Pulmonary artery systolic pressure was 41 mmHg.  2. Pulmonary fibrosis.  The patient is on home O2; however, she is      only wearing it at night.  She did quit smoking in 1955.  She has      been followed by Pulmonology in the past; however, she is not      currently seeing a pulmonologist.  3. Status post left hip fracture with pinning in September 2009.  4. History of falls.  5. Possible TIAs.  6. History of UTIs.  There is a question of whether her pulmonary      fibrosis was triggered as an adverse reaction  to Macrodantin.  7. Gastroesophageal reflux disease.  8. Macular degeneration.  9. Hypercholesterolemia.   SOCIAL HISTORY:  The patient quit tobacco in 1955.  She is divorced.  She lives by herself in a mobile home.  She does have a caregiver who  comes 5 times a week and her daughter also visits her frequently.   EKG was reviewed.  It showed normal sinus rhythm.  There was evidence  for old anterior MI.  There was slight ST elevation in V3 and V4 that  maybe consistent with maybe an evidence for an aneurysm.  There is also  an old lateral MI.   MEDICATIONS:  1. Losartan 50 mg daily.  2. Sulindac b.i.d.  3. Lasix 40 mg daily.  4. Digoxin 0.125 mg daily.  5. Coreg 6.25 mg b.i.d.  6. Amitriptyline.  7. Lovastatin 10 mg daily.  8. Nitro-Dur 0.3 q.12 h. topical.  9. Nexium 40 mg daily.  10.Aspirin 325 mg daily.  11.Albuterol metered-dose inhaler p.r.n.   Most recent labs October 2009, creatinine 0.57.   PHYSICAL EXAMINATION:  VITAL SIGNS:  Blood pressure is 114/66, heart  rate is 68 and regular, and weight is 143 pounds.  GENERAL:  This is an elderly female in no apparent distress.  NEUROLOGIC:  Alert and oriented x3.  Normal affect.  NECK:  JVP is 8 to 9 cm.  There is no thyromegaly or thyroid nodule.  CARDIOVASCULAR:  Heart, regular, S1 and S2.  There is a 1/6  systolic  ejection murmur.  No gallop.  There is trace ankle edema.  ABDOMEN:  Soft and nontender.  No hepatosplenomegaly.  EXTREMITIES:  No clubbing or cyanosis.  LUNGS:  There are dry crackles about third of the way up the bilateral  lung fields.   ASSESSMENT AND PLAN:  This is an 77 year old with history of pulmonary  fibrosis on home oxygen and ischemic cardiomyopathy who presents to  Cardiology Clinic for evaluation of her cardiac problems.  1. Ischemic cardiomyopathy.  The patient never had a heart      catheterization; however, she does have an apical aneurysm on      echocardiogram done back in 2005 and evidence for an old anterior      MI with probable aneurysm on EKG.  She was managed conservatively      given the fact that she had an out of hospital MI and likely has a      nonviable apex.  She does continue to have chronic atypical chest      pain.  I am not sure if this actually represents ischemia or is      some other process.  This has been going on for many years and the      pattern has not changed.  We will continue on her current      management for coronary disease including aspirin, statin, ARB, and      beta-blocker.  I will increase her Coreg to 9.375 mg twice a day      today as if her chest pain is indeed ischemic, this may help      control it.  2. Ischemic cardiomyopathy.  The patient's EF was 25-35% when last      checked.  She does appear very mildly volume overloaded.  She is      comfortable.  She has New York Heart Association class II to III      symptoms.  She is able to walk with a  walker and walk around her      house without shortness of breath.  I think for now I will continue      her on her current dose of Lasix at 40 mg daily.  She is on      losartan.  She will continue losartan 50 mg daily and I will      increase her Coreg to 9.375 mg twice a day.  When she comes in      next, I will increase her Coreg to 12.5 mg twice a day and probably       increase her losartan a bit also.  She will also continue her      digoxin.  The patient will follow up in 4 months.  3. Hyperlipidemia.  We need to check the patient's lipids and LFTs as      this has not been done for a while.  We should titrate up her      statin to keep her LDL less than 70.  4. Pulmonary.  The patient does have pulmonary fibrosis, has dry      crackles on her lung exam.  I did encourage her to use her oxygen      during the day as she has been told by her pulmonologist in the      past.  She should avoid Macrodantin when she has UTIs as this maybe      related to the development of her pulmonary hypertension.     Marca Ancona, MD  Electronically Signed    DM/MedQ  DD: 12/22/2008  DT: 12/23/2008  Job #: [id number]   cc:   Willow Ora, MD

## 2011-04-03 NOTE — Consult Note (Signed)
NAMEOVIDA, DELAGARZA                   ACCOUNT NO.:  [id number]   MEDICAL RECORD NO.:  [id number]          PATIENT TYPE:  INP   LOCATION:  0102                         FACILITY:  [facility]   PHYSICIAN:  Georges Lynch. Gioffre, M.D.DATE OF BIRTH:  09/25/1920   DATE OF CONSULTATION:  08/01/2008  DATE OF DISCHARGE:                                 CONSULTATION   I got called to see Mrs. Akhavan at [facility] today on  August 01, 2008 with a history that she fell at home about 2 to 3:00  this morning and injured her left hip.  Came in to the emergency room  with that history.  A plain x-ray failed to show a fracture according to  radiologists, so they did a CT scan that showed a nondisplaced fracture  of the left femoral neck.  Her history certainly is significant for the  fact that was her only main complaint.  The past history under health,  she has a history of pulmonary fibrosis.  She also has a history of  cardiomyopathy with a very low ejection fraction.  Apparently in April  2009, Dr. Diona Browner, cardiologist with the [facility], did a study on  her and it showed she had an ejection fraction of about 34%.  The  patient and family said that she utilizes oxygen at home as well.  History of congestive heart failure and transient ischemic attack.   PAST ORTHOPEDIC SURGICAL HISTORY:  She has a hemiarthroplasty of the  left shoulder done approximately 5 years ago.   ALLERGIES:  According to the family, denies.   PHYSICAL EXAM:  CONSTITUTIONAL:  She is alert and oriented.  NECK:  Negative.  No signs of any fractures.  HEAD:  She has little contusion to her scalp posteriorly but she did not  lose consciousness.  VITAL SIGNS:  The overall blood pressure is 139/62, pulse 64,  respirations 18, temperature 97.8.  MEDICATIONS:  She is on a long list of medications that are all listed  on her chart.  She is not on an anticoagulant at this time.  MUSCULOSKELETAL:  The exam of her upper  extremities shows a healed  incision site over the left anterior shoulder with good motion and no  pain.  Right shoulder with no pain with right shoulder motion.  The neck  was negative.  The back, she had no complaints of any back pain.  ABDOMEN:  Exam negative.  CHEST:  Negative.  To palpation of her ribs, the hips, the right hip and  right lower extremities, normal left hip, she had painful limited motion  of the left lower extremity consistent with her hip fracture.  Circulation was intact.  Motor was difficult to do a good motor exam of  the left lower extremity because of her problem, because of her pain.   FAMILY HISTORY:  None.   SURGICAL HISTORY:  Back surgery and shoulder surgery.   SOCIAL HISTORY:  Nonsmoker, nondrinker.  No drug abuse.    The x-rays of the hip and CT showed a nondisplaced femoral neck  fracture.   IMPRESSION:  1. A nondisplaced fracture of the left femoral neck.  2. Pulmonary fibrosis.  3. Congestive heart failure.  4. Transient ischemic attack.   COMMENT:  I had a long discussion with her daughter regarding the case.  Dr. Debby Bud admitted her.  Will see her.  She once Dr. Drue Novel to speak with  the patient tomorrow before any surgery is scheduled and I agree.  I  think we need to have medical clearance from her private medical  physician.  I did explain to the family that there is always a chance  that this fracture could displace or she may need a prosthesis instead  of a hip pinning, and they understand that as well.  We will put her in  [employer] traction to keep her at bedrest.           ______________________________  Georges Lynch. Darrelyn Hillock, M.D.     RAG/MEDQ  D:  08/01/2008  T:  08/01/2008  Job:  [id number]   cc:   Rosalyn Gess. Norins, MD  [address]  [place]  [place] [address]   Willow Ora, MD  [phone] [place], [place] [address]

## 2011-04-03 NOTE — Op Note (Signed)
[name], [name]                   ACCOUNT NO.:  [id number]   MEDICAL RECORD NO.:  [id number]          PATIENT TYPE:  INP   LOCATION:  1402                         FACILITY:  [facility]   PHYSICIAN:  Ollen Gross, M.D.    DATE OF BIRTH:  28-Jan-1920   DATE OF PROCEDURE:  08/02/2008  DATE OF DISCHARGE:                               OPERATIVE REPORT   PREOPERATIVE DIAGNOSIS:  Nondisplaced left femoral neck fracture.   POSTOPERATIVE DIAGNOSIS:  Nondisplaced left femoral neck fracture.   PROCEDURE:  In situ pinning, left femoral neck fracture.   SURGEON:  Ollen Gross, M.D.   ASSISTANT:  Avel Peace PA-C   ANESTHESIA:  Spinal.   BLOOD LOSS:  Minimal.   DRAINS:  None.   COMPLICATIONS:  None.   CONDITION:  Stable to recovery.   BRIEF CLINICAL NOTE:  [name] is an 77 year old female who fell  yesterday sustaining a nondisplaced occult femoral neck fracture.  It  was not obvious on plain films but did show up on CT.  She has been  cleared medically and presents now for in situ pinning of femoral neck  fracture.   PROCEDURE IN DETAIL:  After the successful administration of spinal  anesthetic, the patient was placed on the fracture table with her left  foot in a well-padded traction boot and right leg in the well-padded leg  holder.  I did not need to apply traction as this was a nondisplaced  fracture.  The thigh is prepped and draped in usual sterile fashion.  Under fluoroscopic guidance I placed a guide pin over the proximal thigh  so as to gain the angle of inclination for my pins.  I made a small  incision on the lateral thigh about 1-1/2 inches.  Skin was cut through  subcutaneous tissue to the fascia lata which was incised in line with  the skin incision.  The guide pin for the 86.5 mm cannulated screw was  then placed on the lateral cortex of the femur and passed through the  lateral cortex into the center of the femoral head on neck in both AP  and lateral views.  An  additional pin is placed anterior to this one and  then another one placed inferior to this one.  They were all parallel  and found to be in good position in the femoral head and neck.  The  length was 90 mm for each screw.  We then placed 6.5 mm cannulated  screws over the pins.  We tightened them down to the lateral cortex of  the femur.  The guide pins were then removed.  I was very pleased with  the positioning of the screws.  I then copiously irrigated the wound  with saline solution closing the fascia lata with  interrupted #1 Vicryl, subcu 2-0 Vicryl, subcuticular running 4-0  Monocryl.  Incision was cleaned and dried and Steri-Strips applied.  A  bulky sterile dressing is applied.  She is awakened and transferred to  recovery in stable condition.      Ollen Gross, M.D.  Electronically Signed  FA/MEDQ  D:  08/02/2008  T:  08/03/2008  Job:  [id number]

## 2011-04-03 NOTE — H&P (Signed)
NAMENYGERIA, LAGER NO.:  [id number]   MEDICAL RECORD NO.:  [id number]          PATIENT TYPE:  EMS   LOCATION:  ED                           FACILITY:  [facility]   PHYSICIAN:  Isidor Holts, M.D.  DATE OF BIRTH:  01/05/1920   DATE OF ADMISSION:  08/19/2008  DATE OF DISCHARGE:                              HISTORY & PHYSICAL   PRIMARY CARE PHYSICIAN:  Maxwell Caul, M.D., [facility].   PRIMARY ORTHOPEDIC SURGEON:  Ollen Gross, M.D.   PRIMARY UROLOGIST:  Maretta Bees. Vonita Moss, M.D.   CHIEF COMPLAINT:  Altered mental status, increasing lethargy for 4 days,  fall on August 16, 2008, pyrexia of 104 degrees today, diarrhea for  the past 2-3 days.   HISTORY OF PRESENT ILLNESS:  This is an 77 year old female.  For past  medical history, see below.  According to the patient's daughter,  Nicole Cella, who was present in the emergency department, supplemented by  the patient herself, the patient started feeling unwell since August 16, 2008, with increasing lethargy and weakness.  She actually fell on  August 16, 2008, was seen by Dr. Ollen Gross who confirmed that the  patient had no bony injuries.  She developed diarrhea from August 13, 2008, passing stools about 3-4 times a day, described as watery, no  blood or mucus.  The patient admits to chills and reportedly had a fever  of 104 degrees today.  Of note, she was recently on Ceftin for urinary  tract infection, which was completed on August 07, 2008.   PAST MEDICAL HISTORY:  1. Chronic systolic heart failure secondary to ischemic      cardiomyopathy.  Ejection fraction 25% to 30%.  2. COPD/O2 dependent pulmonary fibrosis.  3. DJD.  4. Chronic neck pain.  5. History of TIAs.  6. Macular degeneration.  7. GERD.  8. Status post cholecystectomy.  9. Status post left hip fracture September 2009 required ORIF.  10.Status post hysterectomy.  11.Status post lumbar spinal fusion x3.  12.Recurrent UTIs.  13.Dyslipidemia.  14.Hypertension.  15.DNR status.   MEDICATION HISTORY:  1. Digoxin 125 mcg p.o. daily.  2. Coreg 6.25 mg p.o. b.i.d.  3. Cozaar 50 mg p.o. daily.  4. Lovastatin 10 mg p.o. daily.  5. Nitro-Dur patch 0.3 mg one patch to skin daily.  6. Lasix 40 mg p.o. daily.  7. Estrace 0.01% cream to apply topically Mondays, Wednesdays and      Fridays.  8. Sulindac 200 mg p.o. b.i.d.  9. Atrovent MDI 2 puffs q.i.d. p.r.n.  10.Ambien 5 mg p.o. p.r.n. q.h.s.  11.Amitriptyline 25 mg p.o. q.h.s.  12.Fosamax 70 mg p.o. q. weekly.  13.Nexium 40 mg p.o. daily.   ALLERGIES:  No known drug allergies.   REVIEW OF SYSTEMS:  As per HPI and chief complaint, otherwise negative.   SOCIAL HISTORY:  The patient is a resident of [facility]  nursing facility.  Ex-smoker, smoked for 30 years but quit over 30 years  ago.  Nondrinker.  Has no history of drug abuse.   FAMILY  HISTORY:  Noncontributory.   PHYSICAL EXAMINATION:  VITAL SIGNS:  Temperature 99.5, pulse 60 per  minute and regular, respiratory rate 20, BP 122/70 mmHg, pulse oximeter  98% on room air.  GENERAL:  The patient did not appear to be in obvious acute discomfort  at time of this evaluation.  Alert, communicative, not short of breath  at rest.  HEENT:  No clinical pallor.  No jaundice.  No conjunctival injection.  Throat visible mucous membranes appear dry.  NECK:  Supple.  JVP not seen.  No palpable lymphadenopathy.  No palpable  goiter.  CHEST:  Clinically clear to auscultation.  No wheezes or crackles heard.  HEART:  Heart sounds 1 and 2 are heard.  Normal and regular, no murmurs.  ABDOMEN:  Obese, soft, nontender.  No palpable organomegaly.  No  palpable masses.  Normal bowel sounds.  LOWER EXTREMITIES:  Examination showed no pitting edema.  Palpable  peripheral pulses.  MUSCULOSKELETAL:  Osteoarthritic changes are noted.  CENTRAL NERVOUS SYSTEM:  No focal neurologic deficit on gross   examination.   INVESTIGATIONS:  CBC - WBC 10.8, hemoglobin 14.2, hematocrit 43.3,  platelets 192.  Electrolytes - sodium 131, potassium 3.7, chloride 98,  CO2 26, BUN 14, creatinine 0.8, glucose 94.  Urinalysis shows WBC 11-20,  RBC 0-2, bacteria many.  Digoxin level is 0.5.   ASSESSMENT AND PLAN:  1. Urinary tract infection.  The patient has yet another recurrence,      as evidenced by positive urinary sediment.  She has been started      empirically on intravenous Rocephin in the emergency department.  I      agree with this choice, and will continue.  Meanwhile have sent off      urine for cultures.   1. Diarrhea.  It is important here to rule out C. difficile colitis,      as the patient has been on multiple antibiotic treatments for      urinary tract infection as recently as August 07, 2008.  We      shall do stool studies, start empiric Flagyl, placed on contact      isolation.   1. Mild dehydration.  As evidenced by elevated BUN/creatinine ratio.      We shall hold diuretics for now.  Start gentle intravenous fluid      hydration.   1. Altered mental status.  This is multifactorial, secondary to #s 1      and 2 above.   1. Gastroesophageal reflux disease.  We shall continue proton pump      inhibitor.   1. Dyslipidemia.  We shall continue pre-admission statin treatment.   1. Chronic obstructive pulmonary disease.  This appears stable.  We      shall utilize bronchodilator nebulizers p.r.n. as well as oxygen      supplementation if needed.   1. Hypertension.  This appears controlled at present.  We shall      continue pre-admission medications.   Further management will depend on clinical course.  1. Note, the patient presents with out of facility DNR form.  We shall      respect this status throughout the course of her hospitalization.      Isidor Holts, M.D.  Electronically Signed     CO/MEDQ  D:  08/19/2008  T:  08/19/2008  Job:  [id number]   cc:    Maxwell Caul, M.D.   Ollen Gross, M.D.  Fax: [phone]   Maretta Bees.  Vonita Moss, M.D.  Fax: [phone]

## 2011-04-03 NOTE — Assessment & Plan Note (Signed)
[facility]                            CARDIOLOGY OFFICE NOTE   NAME:Solem, GEONNA LOCKYER                          MRN:          [id number]  DATE:09/04/2007                            DOB:          10/23/1920    PRIMARY CARE PHYSICIAN:  Willow Ora, M.D.   REASON FOR VISIT:  Followup cardiomyopathy.   HISTORY OF PRESENT ILLNESS:  Ms. Picariello is relatively stable.  She has an  ischemic cardiomyopathy with an ejection fraction of 25-35% as well as  underlying significant pulmonary fibrosis on home oxygen.  She is being  managed conservatively with medical therapy.  Device therapy has not  been pursued.  She is not having any problems with palpitations or  syncope.  Her electrocardiogram today shows a sinus rhythm with evidence  of previous anterior infarct and aneurysmal change manifested in ST  segment changes across the precordium.  There is no significant change  in comparison to her previous tracing.  She is not reporting any  significant angina.  She has baseline dyspnea on exertion which has not  changed and is NYHA Class II-III.  I asked her last time to decrease her  digoxin to every other day, although she did not make this change as  yet.  I was hoping to avoid any problems with toxicity given her  baseline creatinine of 1.2 in the past.   ALLERGIES:  IMDUR.   PRESENT MEDICATIONS:  1. Cozaar 50 mg p.o. daily.  2. Sulindac 200 mg p.o. b.i.d.  3. Lasix 40 mg p.o. daily.  4. Digoxin 125 mcg p.o. daily.  5. Coreg 6.25 mg p.o. b.i.d.  6. Amitriptyline 25 mg p.o. q.h.s.  7. Lovastatin 10 mg p.o. q.h.s.  8. Nitro-Dur 0.3 mg q.12 h.  9. Flomax 70 mg weekly.  10.Nexium 40 mg p.o. daily.  11.Estrace cream.  12.Zolpidem 5 mg p.o. daily.  13.Lidoderm patch.  14.Recent course of Macrobid for a urinary tract infection.   REVIEW OF SYSTEMS:  As described in the history of present illness,  otherwise negative,.   PHYSICAL EXAMINATION:  VITAL SIGNS:  Blood  pressure is 128/70, heart  rate 65, weight is stable at 130 pounds.  GENERAL:  The patient is comfortable and in no acute distress.  NECK:  Reveals no elevated jugular venous pressure .  LUNGS:  Exhibit coarse breath sounds.  No wheezing.  CARDIAC:  Reveals a regular rate and rhythm.  No loud murmur or gallop.  EXTREMITIES:  Show no pitting edema.   IMPRESSION/RECOMMENDATIONS:  1. Ischemic cardiomyopathy with an ejection fraction of 25-35%.  We      will plan to continue present medical therapy with a decrease in      digoxin to every other day dosing.  I will plan to see her back for      symptom review in the next 6 months.  The plan has been      conservative medical therapy.  2. Pulmonary fibrosis.     [name], MD  Electronically Signed    SGM/MedQ  DD: 09/04/2007  DT: 09/05/2007  Job #: [id number]   cc:   Willow Ora, MD

## 2011-04-03 NOTE — Discharge Summary (Signed)
NAMESHELTON, SOLER NO.:  [id number]   MEDICAL RECORD NO.:  [id number]          PATIENT TYPE:  INP   LOCATION:  1434                         FACILITY:  [facility]   PHYSICIAN:  Isidor Holts, M.D.  DATE OF BIRTH:  Feb 15, 1920   DATE OF ADMISSION:  08/19/2008  DATE OF DISCHARGE:  08/24/2008                               DISCHARGE SUMMARY   PMD:  Maxwell Caul, MD, [facility].   PRIMARY ORTHOPEDIC SURGEON:  Ollen Gross, MD.   PRIMARY UROLOGIST:  Maretta Bees. Vonita Moss, MD.   DISCHARGE DIAGNOSES:  1. Enterococcal urinary tract infection.  2. Clostridium difficile colitis.  3. Dehydration, secondary to #s 1 and 2 above.  4. Altered mental status, multifactorial, secondary to #s 1, 2 and 3      above.  5. History of hypertension.  6. Congestive/Ischemic cardiomyopathy, ejection fraction 25% to 30%.  7. Gastroesophageal reflux disease.  8. Dyslipidemia.  9. Chronic obstructive pulmonary disease/pulmonary fibrosis.  10.Degenerative joint disease.   DISCHARGE MEDICATIONS:  1. Digoxin 125 mcg p.o. daily.  2. Coreg 6.25 mg p.o. b.i.d.  3. Cozaar 50 mg p.o. daily.  4. Lovastatin 10 mg p.o. daily.  5. Nitro-Dur patch 0.3 mg, 1 patch to skin daily.  6. Lasix 40 mg p.o. daily.  7. Estrace 0.1% cream applied topically Mondays, Wednesdays and      Fridays.  8. Atrovent MDI 2 puffs q.i.d. p.r.n.  9. Ambien 5 mg p.o. p.r.n. nightly.  10.Amitriptyline 25 mg p.o. nightly.  11.Fosamax 70 mg p.o. before meals.  12.Nexium 40 mg p.o. daily.  13.Tylenol Extra Strength 1 gram p.o. p.r.n. q.6 h.  14.Amoxicillin 500 mg p.o. t.i.d. to be completed on August 26, 2008.  15.Flagyl 500 mg p.o. t.i.d., to be completed on September 01, 2008.   PROCEDURES:  None.   CONSULTATIONS:  None.   ADMISSION HISTORY:  As in H and P notes of August 19, 2008.  However, in  brief, this is an 77 year old female with a rather complex past medical  history, consisting of systolic  dysfunction/ischemic cardiomyopathy,  ejection fraction 25% to 30%, COPD/O2 dependent, pulmonary fibrosis,  DJD, chronic neck pain, history of TIAs, macular degeneration, GERD,  status post cholecystectomy, status post left hip fracture September  2009, required ORIF, status post hysterectomy, status post lumbar spinal  fusion x3, recurrent UTIs, dyslipidemia, hypertension, presenting with  altered mental status described as increasing lethargy for 4 days, also  temperature of 104 degrees and diarrhea for the preceding 2-3 days, all  against the background of recent antibiotic therapy with Ceftin.  She  was admitted for further evaluation, investigation and management.   CLINICAL COURSE:  1. Enterococcal UTI.  The patient was found to have a positive urinary      sediment, consistent with urinary tract infection on initial      evaluation.  She was, therefore, empirically started on intravenous      Rocephin.  Subsequent urine cultures confirmed enterococcal UTI,      and as of August 22, 2008, she had completed a 5-day course  of      Rocephin with clinical improvement.  She was, therefore,      transitioned to oral Amoxicillin for a further 5 days of antibiotic      therapy.   1. C. difficile colitis.  As mentioned in presenting history, patient      did have diarrhea 2-3 days prior to presentation, against a      background of recent antibiotic therapy and recent      hospitalizations.  There was therefore, a high index of suspicion      for C. difficile colitis.  She was placed on contact isolation,      started empirically on Flagyl and stool samples came back positive      for C. difficile toxin.  The plan is to complete a 14-day course of      Flagyl, as she is to be on concomitant antibiotic therapy for her      enterococcal UTI.  Clinically, as of August 23, 2008, she had      improved with complete resolution of diarrhea, felt considerably      better.  She was also during the  course of this hospitalization,      managed with Questran and Flora-Q.   1. Dehydration.  The patient at the time of presentation, was found to      have a BUN of 14 with creatinine of 0.8, consistent with mild      dehydration.  She was managed with gentle intravenous fluids.  We      were able to subsequently discontinue this on August 22, 2008,      without any deleterious consequences and her BUN as of August 23, 2008, was 7 with a creatinine of 0.50.   1. Chronic obstructive pulmonary disease/pulmonary fibrosis.  There      were no problems referable to this, and patient consistently      saturated above 90% on room air.   1. Gastroesophageal reflux disease.  This was managed with proton pump      inhibitor.   1. Dyslipidemia.  The patient was continued on statin treatment.   1. DJD.  There were no problems referable to this.   1. Altered mental status.  This was clearly multifactorial, against      the background of patient's acute presenting problems.  As of      August 21, 2008, mental status was back to baseline.   1. Ischemic/Congestive cardiomyopathy.  The patient during the course      of this hospitalization, showed no clinical features of congestive      heart failure. She was on August 23, 2008, recommencd on pre-      admission Lasix, as her fluid status appeared satisfactory.   DISPOSITION:  The patient was on August 23, 2008, considered  sufficiently clinically recovered for discharge to be considered on  August 24, 2008, and provided no acute problems arise in the interim,  she will be discharged accordingly.   DIET:  Heart-healthy.   ACTIVITY:  As tolerated, otherwise per PT/OT.   FOLLOW-UP INSTRUCTIONS:  The patient is recommended to follow up with  her primary MD, Dr. Baltazar Najjar, [facility], and her  primary orthopedic surgeon, Dr. Ollen Gross, as well as her primary  urologist, Dr. Larey Dresser, routinely per prior scheduled   appointments.      Isidor Holts, M.D.  Electronically Signed     CO/MEDQ  D:  08/23/2008  T:  08/23/2008  Job:  [id number]   cc:   Maxwell Caul, M.D.   Ollen Gross, M.D.  Fax: [phone]   Maretta Bees. Vonita Moss, M.D.  Fax: [phone]

## 2011-04-06 ENCOUNTER — Inpatient Hospital Stay (HOSPITAL_COMMUNITY)
Admission: EM | Admit: 2011-04-06 | Discharge: 2011-04-12 | DRG: 194 | Disposition: A | Discharge status: Home Health | Payer: Medicare Other | Attending: Internal Medicine | Admitting: Internal Medicine

## 2011-04-06 ENCOUNTER — Emergency Department (HOSPITAL_COMMUNITY): Payer: Medicare Other

## 2011-04-06 DIAGNOSIS — I428 Other cardiomyopathies: Secondary | ICD-10-CM | POA: Diagnosis present

## 2011-04-06 DIAGNOSIS — Z8673 Personal history of transient ischemic attack (TIA), and cerebral infarction without residual deficits: Secondary | ICD-10-CM

## 2011-04-06 DIAGNOSIS — J189 Pneumonia, unspecified organism: Principal | ICD-10-CM | POA: Diagnosis present

## 2011-04-06 DIAGNOSIS — I5022 Chronic systolic (congestive) heart failure: Secondary | ICD-10-CM | POA: Diagnosis present

## 2011-04-06 DIAGNOSIS — J4489 Other specified chronic obstructive pulmonary disease: Secondary | ICD-10-CM | POA: Diagnosis present

## 2011-04-06 DIAGNOSIS — H3530 Unspecified macular degeneration: Secondary | ICD-10-CM | POA: Diagnosis present

## 2011-04-06 DIAGNOSIS — N390 Urinary tract infection, site not specified: Secondary | ICD-10-CM | POA: Diagnosis present

## 2011-04-06 DIAGNOSIS — Z66 Do not resuscitate: Secondary | ICD-10-CM | POA: Diagnosis present

## 2011-04-06 DIAGNOSIS — I2510 Atherosclerotic heart disease of native coronary artery without angina pectoris: Secondary | ICD-10-CM | POA: Diagnosis present

## 2011-04-06 DIAGNOSIS — J449 Chronic obstructive pulmonary disease, unspecified: Secondary | ICD-10-CM | POA: Diagnosis present

## 2011-04-06 DIAGNOSIS — J8410 Pulmonary fibrosis, unspecified: Secondary | ICD-10-CM | POA: Diagnosis present

## 2011-04-06 DIAGNOSIS — I509 Heart failure, unspecified: Secondary | ICD-10-CM | POA: Diagnosis present

## 2011-04-06 DIAGNOSIS — I10 Essential (primary) hypertension: Secondary | ICD-10-CM | POA: Diagnosis present

## 2011-04-06 DIAGNOSIS — M810 Age-related osteoporosis without current pathological fracture: Secondary | ICD-10-CM | POA: Diagnosis present

## 2011-04-06 DIAGNOSIS — K219 Gastro-esophageal reflux disease without esophagitis: Secondary | ICD-10-CM | POA: Diagnosis present

## 2011-04-06 DIAGNOSIS — F0390 Unspecified dementia without behavioral disturbance: Secondary | ICD-10-CM | POA: Diagnosis present

## 2011-04-06 LAB — COMPREHENSIVE METABOLIC PANEL
ALT: 28 U/L (ref 0–35)
Albumin: 3.2 g/dL — ABNORMAL LOW (ref 3.5–5.2)
Alkaline Phosphatase: 45 U/L (ref 39–117)
BUN: 19 mg/dL (ref 6–23)
Calcium: 10.4 mg/dL (ref 8.4–10.5)
Potassium: 4.2 mEq/L (ref 3.5–5.1)
Sodium: 135 mEq/L (ref 135–145)
Total Protein: 7.2 g/dL (ref 6.0–8.3)

## 2011-04-06 LAB — URINALYSIS, ROUTINE W REFLEX MICROSCOPIC
Glucose, UA: NEGATIVE mg/dL
Protein, ur: NEGATIVE mg/dL
pH: 5.5 (ref 5.0–8.0)

## 2011-04-06 LAB — DIFFERENTIAL
Basophils Absolute: 0 10*3/uL (ref 0.0–0.1)
Basophils Relative: 0 % (ref 0–1)
Eosinophils Absolute: 0.1 10*3/uL (ref 0.0–0.7)
Eosinophils Relative: 1 % (ref 0–5)

## 2011-04-06 LAB — CBC
Platelets: 232 10*3/uL (ref 150–400)
RDW: 14.2 % (ref 11.5–15.5)
WBC: 17 10*3/uL — ABNORMAL HIGH (ref 4.0–10.5)

## 2011-04-06 LAB — URINE MICROSCOPIC-ADD ON

## 2011-04-06 NOTE — Consult Note (Signed)
[name], [name]                             ACCOUNT NO.:  [id number]   MEDICAL RECORD NO.:  [id number]                   PATIENT TYPE:  INP   LOCATION:  0349                                 FACILITY:  [facility]   PHYSICIAN:  Marrian Salvage. Freida Busman, M.D. [facility]            DATE OF BIRTH:  03-13-1920   DATE OF CONSULTATION:  DATE OF DISCHARGE:                                   CONSULTATION   CONSULTING PHYSICIAN:  Larry A. Freida Busman, M.D. [facility]   CARDIOLOGIST:  New to [facility].   PRIMARY CARE PHYSICIAN:  Dr. [name] in [place].   REASON FOR CONSULTATION:  Congestive heart failure/cardiomyopathy.   SOURCE OF INFORMATION:  The patient and medical records.  The patient is a  very poor historian.   HISTORY OF PRESENT ILLNESS:  The patient is an 77 year old female, with a  history of CHF poorly characterized in the past as well as COPD and  pulmonary fibrosis on home oxygen, who was admitted with hypoxic respiratory  failure.  She was initially worked up and was found to have a significant  fibrosis on chest CT; furthermore, a BNP was positive, and she underwent  echocardiography which showed significant LV dysfunction with an EF of 25-  35% including significant wall motion abnormality in the apical inferior  septal region.  EKG also showed anterolateral Q waves with poor precordial R  wave progression as well as inferior Q waves.  The patient herself notes  that she had exertional chest pressure and dyspnea on exertion for many  years.  Her symptoms sub-acutely worsened over the last few weeks.  She  denies significant chest pressure or chest pain symptoms.  At this point in  her hospitalization, she has been treated with antibiotics and IV Lasix,  after her admission on December 28, 2003.  Her symptoms have improved  somewhat with this treatment, but she is not back to baseline and continues  to have significant tachypnea and shortness of breath.   The patient's prior medical history  involving her cardiac disease is not  well characterized.  The patient, herself though a poor historian, says that  she had a workup in [place] as well as in [facility] for CHF preoperatively  before spinal surgery.  She states that she was not given a specific  diagnosis.  She denies a prior history of MI.   PAST MEDICAL HISTORY:  1. COPD, oxygen dependent.  2. Pulmonary fibrosis.  3. Congestive heart failure.  4. Hypertension.  5. Degenerative joint disease.  6. Status post multiple spinal surgeries.  7. Urinary tract infections frequently.  8. A history of TIAs.  9. Macular degeneration.   The patient has not had prior cardiac catheterization.  She has not had  prior cardiac surgery.  Her EF, prior to this admission, was unknown.   ALLERGIES:  No known drug allergies.   MEDICATIONS:  1.  Atrovent nebulizer.  2. Zithromax.  3. Ventolin nebulizer.  4. Lovenox 40 mg daily.  5. Protonix 40 mg b.i.d.  6. Humibid.  7. Reglan 10 q.i.d.  8. Sliding scale insulin.  9. Solu-Medrol 80 mg IV q.12h.  10.      Cozaar 50 mg daily.  11.      Rocephin 1 gram IV q.24h.   SOCIAL HISTORY:  The patient lives in [place].  She lives alone.  Her  daughter lives nearby.  She has another daughter in [place], [place] and a son  in [place], [place].  She is widowed and her husband died long ago of  cancer.  She quit smoking 24 years ago.  She has never used alcohol.  She  denies use of illicit drugs or significant herbal medications.   FAMILY HISTORY:  The patient has a son who had coronary artery bypass  surgery at age 63 and two brothers with a history of coronary artery bypass  surgery.   REVIEW OF SYSTEMS:  Positive for fevers, blurred vision, mild epigastric  discomfort, shortness of breath, dyspnea on exertion, PND, cough,  arthralgias, nausea, otherwise negative in detail.  Specifically, she denies  hemoptysis or blood per rectum.  She has not had significant chest pressure.    PHYSICAL EXAMINATION:  VITAL SIGNS:  Today temperature is 97.8, pulse 77,  blood pressure 121/75, respirations 20, oxygen saturation 95% on 2 liters.  GENERAL:  The patient was well nourished and well-appearing in no apparent  distress but with mild tachypnea when moving in bed or talking.  HEENT:  Her oral mucosa was normal.  No carotid bruits.  Thyroid normal size  without nodules.  JVP was approximately 7- to -8-cm with no hepatojugular  reflex.  LUNGS:  Had fine crackles three quarters of the way up on the right, one  half of the way up on the left.  Her respiratory rate was about 20-25 at  baseline and increased to 40 when she was asked to move in bed.  HEART:  Regular, rate and rhythm with distant heart sounds.  Normal S1,  normal S2.  No S3 or S4 and no significant murmurs.  No renal bruits, no  femoral bruits.  Femoral pulses 2+, DP pulses 2+.  ABDOMEN:  Soft, nontender with no hepatomegaly.  EXTREMITIES:  Showed just trace edema and were relatively warm.  No spine or  CVA tenderness.  The patient was alert and oriented x 3 but had difficulty  recalling detailed historical facts.   Chest x-ray, on December 29, 2003, showed diffuse bilateral interstitial and  alveolar opacities with decreased lung volumes compared to prior study.   A chest CT, on December 30, 2003, showed chronic interstitial lung disease  consisting of idiopathic pulmonary fibrosis, calcified nodule with hilar  lymph nodes.   EKG showed normal sinus rhythm at a rate of 87 with mild left axis  deviation.  QRS of 100.  Q waves in the anterior, lateral and inferior  regions with no significant ST-T changes and poor precordial R wave  progression.   Echocardiogram showed a left ventricular ejection fraction of 25-35%,  aneurysmal deformity of the apical wall, akinesis of the septum inferior and distal anterior walls with mild LVH and increased papillary muscle size.   LABORATORY:  Include a white count of 29.2,  hematocrit 37.1, platelets 45.  Sodium 131, potassium 3.9, BUN 26, creatinine 1.2, glucose 207.  ALT 51, T-  bili 0.5, alk phos 64, albumin  2.9.   ASSESSMENT/PLAN:  This is an 77 year old woman with known chronic  obstructive pulmonary disease who now presents with significant hypoxia.  The cause of her hypoxia is unknown but is likely multifactorial.  She  certainly has a history of pulmonary disease including both chronic  obstructive pulmonary disease and a well documented significant interstitial  fibrosis on chest computed tomography.  The progression of her illness over  many years with subacute decompensation argues for interstitial fibrosis as  the most likely culprit.  However, she does have an elevated B-Type  natriuretic peptide of 1030 and significant systolic dysfunction on  echocardiogram consistent with congestive heart failure.  Overall, my  assessment would be that her primary problem is pulmonary and that her heart  failure is only mildly contributing to her symptoms, but she certainly has  significant cardiomyopathy.   PROBLEM LIST:  1. Cardiomyopathy.  Systolic dysfunction documented by echocardiogram.  B-     Type natriuretic peptide is elevated but on examination her jugular     venous pressure is only mildly increased, and she has minimal edema.  Her     significant crackles on exam are likely more from fibrosis rather than     interstitial edema.  The etiology of her cardiomyopathy is unclear but     most likely it is ischemic in origin.  Segmental wall motion     abnormalities as well as her electrocardiogram are consistent with prior     anterior and/or inferior infarct.     a. Treat for systolic dysfunction.  Continue Cozaar.  We will hold off on        beta-blocker currently due to her chronic obstructive pulmonary        disease and lung disease but this should likely be added prior to        discharge.  One could consider adding Aldactone.  She should be         treated with aspirin and statin empirically.  We would recommend        addition of Digoxin 0.125 mg daily.     b. Volume status.  The patient has mild volume overload at this point.        Her blood urea nitrogen to creatinine ratio is relatively high, but        her renal function is intact apparently.  We would recommend        continuation of Lasix at this point for a gentle diuresis of        approximately 500 cc/day.  Input, output and weight daily should be        recorded.  Continued diuresis should be based on following her blood        urea nitrogen and creatinine as well as her examination and symptoms.     c. Workup other causes of cardiomyopathy.  I would recommend sending a        troponin times one.  Thyroid-stimulating hormone and iron and a total        iron-binding capacity, SPAP and UPAP given her back pain and spinal       disease.  ANA given her idiopathic fibrosis of her lungs.  Ultimately,        this will most likely represent ischemic disease.  The patient is        likely a very poor coronary artery bypass graft candidate, but        theoretically could benefit from a  percutaneous coronary intervention;        therefore, we would consider cardiac catheterization prior to        discharge.   1. Hypertension.  The patient should be treated for hypertension with the     above cardiomyopathy medications.  Goal blood pressure should be roughly     120/60s.   1. Coronary artery disease.  As above, she likely has a history of     myocardial infarction.  Again, we would recommend statin, aspirin, beta-     blocker, ARB.   We will continue to follow with you and ultimately may pursue cardiac  catheterization based on the patient's overall goals of care.                                               Marrian Salvage Freida Busman, M.D. [facility]    LAA/MEDQ  D:  01/01/2004  T:  01/01/2004  Job:  [id number]

## 2011-04-06 NOTE — H&P (Signed)
[name], [name] NO.:  [id number]   MEDICAL RECORD NO.:  [id number]                   PATIENT TYPE:  INP   LOCATION:  0153                                 FACILITY:  [facility]   PHYSICIAN:  Jackie Plum, M.D.             DATE OF BIRTH:  07-30-1920   DATE OF ADMISSION:  12/28/2003  DATE OF DISCHARGE:                                HISTORY & PHYSICAL   CHIEF COMPLAINT:  Shortness of breath.   HISTORY OF PRESENT ILLNESS:  The patient is an 77 year old lady with a  history of COPD on home oxygen as well as pulmonary fibrosis and CHF.  She  presents with gradually worsening shortness of breath and cough over the  last three weeks with an increased progression over the last four days.  Her  shortness of breath is worsened by exertion and coughing.  She has not been  producing sputum with her cough.  She gives history of fever and decreased  appetite with nausea and occasional vomiting without any hematemesis.  She  does not have any chest pain, palpitations, paroxysmal dyspnea, leg or calf  pain or swelling.  She has experienced some occasional chills with some mild  sore throat and headaches.  She does not have any micturition, visual  changes, bright red blood per rectum, or melenic stools.  She had  experienced this previously and was told that she had some form of chest  infection.  On presentation to the ED, the patient's O2 saturation was 90%  on oxygen 2 liters per minute by nasal cannula and her temperature was 100  degrees Fahrenheit.  Pulmonary exam was notable for bilateral rales and  cardiac exam was unremarkable.  X-ray of the chest was done which suggests  interstitial lung disease but could not rule out any pneumonia process.  She  was given Rocephin 1 gram IV with Zithromax 500 mg x1 in the ED and we were  called to evaluate for admission.   PAST MEDICAL HISTORY:  History of COPD/emphysema on home oxygen 2 liters per  minute by nasal  cannula.  Has a history of CHF and pulmonary fibrosis as  well as arthritis.  She has also had some problems with spine injuries and  chronic neck pain.   Her medicines include oxygen 2 liters per minute by nasal cannula,  verapamil, Cozaar, Elavil, and Prevacid.  The dosages of these medicines are  unclear at this point and the patient's daughter who is at bedside is going  to bring the medication list with appropriate dosing.   She does not have any allergies.   FAMILY HISTORY:  Family history is insignificant in view of her advanced  age.   SOCIAL HISTORY:  The patient lives in her own home.  She drinks alcohol  occasionally and continues to smoke cigarettes.  Does not use any illicit  drugs.   REVIEW  OF SYSTEMS:  Significant positives and negatives are as per HPI.  Her  other review of systems were unremarkable.   PHYSICAL EXAMINATION:  VITAL SIGNS:  BP 124/47 with a pulse of 93,  respiratory rate of 20.  O2 saturation 91% on oxygen 2 liters per minute by  nasal cannula.  Temperature of 99.4 degrees Fahrenheit.  GENERAL:  She was not in acute cardiopulmonary distress.  HEENT:  Normocephalic, atraumatic.  Pupils were equal, round, reactive to  light and accommodation.  Extraocular movements intact.  She was not  icteric.  Neither was she pale on conjunctival exam.  Oropharynx was  slightly dry without any exudation.  NECK:  Supple.  No JVD.  LUNGS:  She had bilateral coarse, dry crackles with some decreased breath  sounds.  She also had some few wheezes bilaterally.  CARDIAC:  She had regular rate and rhythm without any gallops or murmurs.  ABDOMEN:  Full, nontender.  Bowel sounds were normoactive.  No  hepatosplenomegaly was appreciated.  SKIN:  Warm and dry with normal color.  No rashes were appreciated.  EXTREMITIES:  There was no edema.  CENTRAL NERVOUS SYSTEM:  She is alert and oriented x3.  No acute focal  deficits.   LABORATORY DATA:  White blood count 13.9,  hemoglobin 13.4, hematocrit 39.6,  MCV 84.2, platelet count 387.  Sodium 133, potassium 4.3, chloride 106, CO2  24, glucose 122, BUN 13, creatinine 0.9, calcium 9.  Chest x-ray as noted  above.  Urinalysis was reviewed.  There was no evidence of urinary tract  infection.  Blood cultures were obtained in the ED and those are pending at  this point.   ASSESSMENT:  An 77 year old lady with history of pulmonary fibrosis who  presents with chronically worsening shortness of breath with acute  progression over the last 4 days or so with fever, chills, nausea, vomiting,  and unproductive cough.  Physical exam notable for bilateral dry, coarse  crackles.  She has a mild leukocytosis of 13.9 on CBC exam.  X-rays show  interstitial lung disease.  However, no acute focal pneumonia process was  visualized.   IMPRESSION:  Probably the patient has progression of her interstitial lung  disease.  However, she may have underlying chronic obstructive pulmonary  disease with bronchitis.   PLAN OF CARE:  The patient will be continued on IV Rocephin with p.o.  Zithromax.  We will continue her on oxygen support.  She will receive  nebulizations.  We will repeat an x-ray to be sure that she does not have  pneumonia process.  She may benefit from a CT scan of her chest.  We will  need to contact her pulmonologist in [facility] to figure out what has been  done for her intensive workup of her pulmonary condition.                                               Jackie Plum, M.D.    GO/MEDQ  D:  12/29/2003  T:  12/29/2003  Job:  [id number]

## 2011-04-06 NOTE — H&P (Signed)
[name], LOYAL NO.:  [id number]  MEDICAL RECORD NO.:  [id number]           PATIENT TYPE:  E  LOCATION:  WLED                         FACILITY:  [facility]  PHYSICIAN:  Andreas Blower, MD       DATE OF BIRTH:  Aug 29, 1920  DATE OF ADMISSION:  04/06/2011 DATE OF DISCHARGE:                             HISTORY & PHYSICAL   PRIMARY CARE PHYSICIAN:  Willow Ora, M.D.  NEUROLOGIST:  Dr. Blythe Stanford.  CARDIOLOGIST:  Dr. Sherlie Ban.  CHIEF COMPLAINT:  Cough and fever.  HISTORY OF PRESENT ILLNESS:  Ms. [name] is an 77 year old Caucasian female with history of COPD, chronic systolic congestive heart failure, GERD, osteoporosis, pulmonary fibrosis, TIAs, UTIs, who presents with the above complaints.  Patient currently lives resides at assisted living facility at [place].  She reports that she started coughing about 3 days ago and is productive for whitish sputum.  She started having a fever of 102 yesterday for which she has taken Tylenol as needed.  Because of cough, fever and poor appetite and failure to thrive, she presented to the ER for further management.  Patient in the ER had x-rays which in addition to the fibrosis showed patchy bilateral airspace disease, which may represent edema or pneumonia.  As a result, she is to be admitted to the hospital for further management.  Other than the fever, she does complain about having chills at times.  Denies any nausea or vomiting. Did complain about chest pain with cough.  Does have some shortness of breath with cough.  Denies any abdominal pain, diarrhea.  Did complain about headaches on the right side, but reports that they have been old. Denies any vision changes.  PAST MEDICAL HISTORY: 1. History of coronary artery disease. 2. Chronic systolic heart failure due to ischemic cardiomyopathy with     ejection fraction of 25%-30%. 3. COPD. 4. Hypertension. 5. GERD. 6. Osteoporosis. 7. History of pulmonary fibrosis  diagnosed about 12 years ago. 8. History of TIAs. 9. History of macular degeneration. 10.Hypertension.  SOCIAL HISTORY:  Patient quit smoking about 20-30 years ago.  Does not use any alcohol, currently lives at an assisted living facility at [place].  FAMILY HISTORY:  Significant for mother and five brothers having heart problems.  One sister died of ovarian cancer.  HOME MEDICATIONS:  To be accurately reconciled by pharmacy, but the patient is on: 1. Aspirin 81 mg p.o. daily 2. Digoxin 125 mcg p.o. daily. 3. Losartan 50 mg p.o. daily. 4. Nexium 40 mg p.o. daily. 5. Tamsulosin 0.4 mg p.o. daily. 6. Calcium with vitamin D 600/40 one tablet p.o. daily. 7. Carvedilol 6.25 mg p.o. twice daily. 8. Docusate 100 mg p.o. twice daily. 9. Amitriptyline 25 mg p.o. q.h.s. 10.Estrace 0.01% cream 1 gram into vagina Monday, Wednesday, Friday. 11.Restasis 0.05% one drop both eyes. 12.Hydrocodone 5/325 mg p.o. q.6h. as needed for pain. 13.MiraLax 17 grams p.o. daily. 14.Simvastatin 20 mg p.o. q.h.s. 15.Ambien 5 mg p.o. q.h.s. 16.Ativan 0.5 mg p.o. twice daily as needed for anxiety.  PHYSICAL EXAMINATION:  VITAL SIGNS:  Temperature is 99.0, blood pressure  is 108/49, heart rate 68, respiration 22, satting at 94% on few liters of oxygen. GENERAL:  Patient was alert and oriented, not appear to be in any acute distress, was lying in bed comfortably.  Had intermittent cough at times. HEENT:  Extraocular motions are intact.  Pupils equal, round.  Had slightly dry mucous membranes. NECK:  Supple. HEART:  Regular with S1, S2. LUNGS:  Crackles bilaterally all throughout.  Good air movement. ABDOMEN:  Soft, nontender, nondistended.  Positive bowel sounds. EXTREMITIES:  Patient had good peripheral pulses with trace edema. NEUROLOGIC:  Cranial nerves II through XII grossly intact.  Had 5/5 motor strength in upper as well as lower extremities.  RADIOLOGY/IMAGING:  Patient had chest x-ray, which shows  patchy bilateral airspace disease, which may represent edema or pneumonia superimposed on pulmonary fibrosis.  LABORATORY DATA:  CBC shows a white count of 17.0, hemoglobin 11.9, hematocrit 37.1, platelet count 232,000.  Electrolytes normal with the BUN of 19, creatinine 0.78.  Liver function tests normal except albumin is 3.2.  ASSESSMENT AND PLAN: 1. Shortness of breath:  Likely due to pneumonia.  Patient has fever     and elevated white count.  Patient was started on azithromycin and     ceftriaxone subsequently after which her fever has improved and she     feels better.  The patient also appears dehydrated from fever and     also felt better after getting fluids. 2. Dehydration:  As indicated above likely due to pneumonia, felt     better after gently being hydrated. 3. Leukocytosis:  Due to pneumonia much for now. 4. Hypertension:  Borderline at this time.  Continue home medications.     Hold parameters. 5. History of gastroesophageal reflux disease:  Stable.  Not an active     issue at this time.  Continue home medications with hold     parameters. 6. History of ischemic cardiomyopathy with the ejection fraction of     25%-30% on with chronic systolic heart failure:  Patient appears     compensated at this time.  We will gently hydrate her on     intravenous fluids for dehydration. 7. History of hyperlipidemia:  Continue simvastatin. 8. Dementia:  Stable, not active at this time. 9. Fever:  Likely due to pneumonia. 10.History of pulmonary fibrosis:  Stable at this time.  Outpatient     management. 11.Gastroesophageal reflux disease:  Stable. 12.Prophylaxis:  Lovenox for deep venous thrombosis prophylaxis. 13.Code status:  Patient is do not resuscitate/do not intubate.  This     was discussed with the patient and patient's daughter at the time     of admission.  Time spent on admission, talking to the patient and patient's daughter and coordinating care was 1  hour.     Andreas Blower, MD     SR/MEDQ  D:  04/06/2011  T:  04/06/2011  Job:  [id number]  Electronically Signed by Wardell Heath Nianna Igo  on 04/06/2011 08:58:16 PM

## 2011-04-06 NOTE — Consult Note (Signed)
NAME:  [name], [name]                             ACCOUNT NO.:  [id number]   MEDICAL RECORD NO.:  [id number]                   PATIENT TYPE:  INP   LOCATION:  0153                                 FACILITY:  [facility]   PHYSICIAN:  Casimiro Needle B. Sherene Sires, M.D. [facility]           DATE OF BIRTH:  1919/11/25   DATE OF CONSULTATION:  12/30/2003  DATE OF DISCHARGE:                                   CONSULTATION   PULMONARY CONSULTATION:   CHIEF COMPLAINT:  I have pneumonia.   HISTORY OF ILLNESS:  This is an exceptionally challenging 77 year old white  female who carries a diagnosis of COPD with pulmonary fibrosis O2 dependent  at baseline.  She is debilitated with chronic back pain from multiple  surgeries previously able to tolerate Vioxx but then it was taken off the  market, she was scared of taking Celebrex and now takes Sulindac with  marginal control.  In this background about 3-4 weeks ago she developed  chills with worsening dyspnea and a dry cough, with increasing dyspnea to  the point where she was short of breath at rest on 2 L and came to the  emergency room where she had diffuse infiltrates on x-ray and diagnosed with  probable flare-up of pulmonary fibrosis and admitted to the hospital with  differential diagnosis including pneumonia and flareup of interstitial lung  disease.   It turns out that the patient has multiple sources of care both in [facility] and here in [place].  She was seen by a urologist at least several  weeks ago and placed on Macrodantin.  I was unable even after rephrasing the  question multiple ways to figure out whether her present symptoms started  before or after Macrodantin was started.  She denies any difficulty  swallowing or overt reflux symptoms.  She has chronic neck pain but no  classic connective tissue disease type arthritis complaints, chronic fevers,  chills, sweats but general geriatric failure to thrive is clearly  impressive.  She has no active sinus  complaints or recent dental work or  unusual exposure history including birds.   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  She also has a history of CHF, multiple spine surgeries  and chronic neck pain.  She also describes generalized chest tightness and  chest discomfort that waxes and wanes and worsens with coughing and deep  breathing with the onset of her present illness.   MEDICATIONS:  Verapamil, amitriptyline, Sulindac, Prevacid, Cozaar,  hydrocodone and now refillable Macrodantin with the original prescription I  believe filled December 03, 2003.   ALLERGIES:  None known.   FAMILY HISTORY:  Significant for the absence of respiratory diseases.   SOCIAL HISTORY:  She states she quit smoking 23 years although the record  states she is actively smoking.  She denies any illicit drug exposure.   REVIEW OF  SYSTEMS:  Taken in detail and essentially negative except as noted  above.  She denies any leg swelling or orthopnea.   PHYSICAL EXAMINATION:  This is an exceptionally depressed-appearing elderly  white female who gives a very unusual history in that she tries to tell her  entire medical history from a physician's perspective including trying to  explain to me how her spinal cord connects to her brain and how that causes  her headaches.  Her T-max here in the hospital is 100.  She is now on day #2  of Rocephin and Zithromax empirically as well as Solu-Medrol.  Oropharynx is  clear with no obvious dentition abnormalities.  Nasal turbinates normal.  Ear canals clear are bilaterally.  Neck is supple without cervical  adenopathy, no thyromegaly.  Lung fields reveal classic diffuse inspiratory  crackles worse in the bases than apices, worse posterior than anterior.  There is a regular rhythm with a 1/6 systolic murmur, no definite S3.  Abdomen soft, benign, without palpable organomegaly, mass or tenderness.  Extremities warm without  calf tenderness, cyanosis, clubbing, or edema.   Chest x-ray and CT scan showed diffuse airspace disease with both  interstitial and alveolar components.  Lab data included a white count of  13,900, urinalysis showing moderate leukocyte esterase with rare bacteria,  chemistry profile and blood cultures are all negative and Legionella urinary  antigen is negative.   Her saturations are presently 94% on a 50% face mask with a blood gas  showing on 50% pH 7.41, pCO2 of 34, and a pO2 of 66.   IMPRESSION:  Problem 1. Acute on chronic hypoxemic respiratory failure in  this patient who is debilitated, chronically O2 dependent and is not yet  wheelchair bound but close to it.  The differential diagnosis includes  Macrodantin pulmonary toxicity (although this certainly would not explain  previous pulmonary fibrosis if I understand her correctly that she has not  received Macrodantin before), idiopathic pulmonary fibrosis in the elderly  (which is frequently associated with acid reflux disease and I recommended  therefore maintaining her on high dose proton pump inhibitor), idiopathic  pulmonary fibrosis (which can be quite indolent in the elderly but note the  abscess of clubbing on exam) or a superimposed component of pneumonia and/or  congestive heart failure (in that order of differential diagnosis).  I am  going to check a BNP to be complete and agree with treating her empirically  for pneumonia.  I also agree with steroids acutely.   For now we simply need to try to rehabilitate her by getting her up in a  chair, using incentive spirometry, weaning her oxygen back down to 2 L if  possible and of course avoiding Macrodantin both now and in the future.   Problem 2.  Her general geriatric failure to thrive appears to be related to  deconditioning, chronic pain and perhaps an element of depression.  I note that she is already on amitriptyline but only 25 mg at bedtime and might  consider  increasing that dose but we will leave that to primary care.   Problem 3. Recurrent urinary tract infections will be problematic but we  need to make sure that everyone is on board with understanding that  Macrodantin should be avoiding in this setting ( I cannot prove that it is a  culprit here certainly but I think given her background of pulmonary  fibrosis and her recent downhill course since Macrodantin was started we  will avoid  the confusing issue of whether Macrodantin is contributing by  simply avoiding it completely and using alternative agents.   Problem 4. I am also concerned about this patient's overall affect, attitude  and use of multiple sources of care.  I think this is going to lead to  confusion regarding serial followup and likely to further adverse drug  interactions in the future.  When she arrived at the hospital for instance  she did not know what medicines she was actually taking and it was not known  that she was actually on nitrofurantoin.  My advice would be to let her  primary care doctors refer her  to specialists that they use in the [facility] area or switch to primary  care and specialists in [place] as a second less ideal option (since this  is the first time we are meeting her).  We will need to get all records  transferred if she decides to switch her care here.                                               Charlaine Dalton. Sherene Sires, M.D. [facility]    MBW/MEDQ  D:  12/30/2003  T:  12/30/2003  Job:  [id number]   cc:   Dr. Casimer Bilis [place], [place]   Maretta Bees. Vonita Moss, M.D.  [address]  [place]  [place] [address]  Fax: [phone]

## 2011-04-06 NOTE — Assessment & Plan Note (Signed)
[facility]                              CARDIOLOGY OFFICE NOTE   NAME:[name], [name]                          MRN:          [id number]  DATE:07/01/2006                            DOB:          [birth year]    Primary care physician is Dr. [name].   REASON FOR VISIT:  Routine followup.   I saw Ms. [name] back in January.  She continues to do well with an ischemic  cardiomyopathy.  She is not having any exertional angina and states that her  breathing has been stable.  She also has pulmonary fibrosis.  Her medical  regimen remains unchanged.  She states that her weight has been stable, and  she has had no problems with lower extremity edema or orthopnea.  Her  electrocardiogram today shows stable changes consistent with previous  anterolateral myocardial infarction.   ALLERGIES:  Intolerance to Imdur.   PRESENT MEDICATIONS:  Cozaar 50 mg p.o. q.d., sulindac 200 mg p.o. b.i.d.,  Lasix 40 mg p.o. q.d., digoxin 0.125 mg p.o. q.d., Coreg 6.25 mg p.o.  b.i.d., amitriptyline 25 mg p.o. q.h.s., lovastatin 10 mg p.o. q.h.s., Nitro-  Dur 0.3 mg, Fosamax 70 mg p.o. every week, Nexium 40 mg p.o. q.d. and  Esterace cream.   REVIEW OF SYSTEMS:  As per the history of present illness.   PHYSICAL EXAMINATION:  VITAL SIGNS:  Blood pressure 102/62, heart rate 65.  Weight 126 pounds.  GENERAL:  The patient is comfortable and in no acute distress.  NECK:  Examination of the neck reveals no elevated jugular venous pressure.  LUNGS:  Diminished breath sounds with fine coarse crackles without  significant change.  There is no wheezing.  CARDIAC:  Regular rate and rhythm without loud murmur or gallop.  EXTREMITIES:  No significant pitting edema.   IMPRESSION/RECOMMENDATION:  1. Ischemic cardiomyopathy with an ejection fraction of 25-35%, clinically      stable with NYHA Class II symptoms.  We will continue medical therapy      and symptom review over the next  four months.  2. Otherwise continue regular followup with Dr. Marvis Moeller.                                [name], MD    SGM/MedQ  DD:  07/01/2006  DT:  07/02/2006  Job #:  [id number]   cc:   [name]

## 2011-04-06 NOTE — Assessment & Plan Note (Signed)
[facility]                            CARDIOLOGY OFFICE NOTE   NAME:[name], [name]                          MRN:          [id number]  DATE:10/21/2006                            DOB:          1920/09/21    REASON FOR VISIT:  Routine followup.   HISTORY OF PRESENT ILLNESS:  [name] is an 77 year old woman with an  ischemic cardiomyopathy, a left ventricular ejection fraction of 25% to  35%, that has managed medically.  She has stable NYHA class 2 symptoms,  as before and is denying any active chest pain.  The history is also  complicated by known pulmonary fibrosis.  She reports tolerating her  medicines well and has not had any major complaints other than to say  that her memory has been getting worse.  She saw a television  advertisement for [name] and had questions about this today.  She also  tells me that she is interested in establishing regular care with one of  our primary care physicians.   ALLERGIES:  IMDUR.   PRESENT MEDICATIONS:  1. Cozaar 50 mg p.o. q.d.  2. Sulindac 200 mg p.o. b.i.d.  3. Lasix 40 mg p.o. q.d.  4. Digoxin 125 mcg p.o. q.d.  5. Coreg 6.25 mg p.o. b.i.d.  6. Amitriptyline 25 mg p.o. q.h.s.  7. Lovastatin 10 mg p.o. q.h.s.  8. Nitro-Dur 0.3 mg.  9. Fosamax 70 mg weekly.  10.Nexium 40 mg p.o. q.d.  11.Estrace cream.  12.Nitrofurantoin 100 mg p.o. b.i.d.  13.Zolpidem 5 mg p.o. q.d.  14.Darvocet-N 100 p.r.n.   REVIEW OF SYSTEMS:  As described in the history of present illness.   PHYSICAL EXAMINATION:  VITAL SIGNS:  Blood pressure 118/64, heart rate  64, weight 143 pounds, up from 136 pounds.  GENERAL:  The patient is comfortable and in no acute distress.  NECK:  No elevated jugular venous distention, without bruits.  LUNGS:  Exhibit fine coarse crackles as noted previously.  No labored  breathing or wheezing noted.  HEART:  A regular rate and rhythm without S3 gallop.  EXTREMITIES:  Show no significant pitting  edema.   IMPRESSION/RECOMMENDATIONS:  1. Ischemic cardiomyopathy as outlined above:  Will plan to continue      the present medical regimen.  2. History of pulmonary fibrosis:  She has been followed in the      pulmonary division in the past.  She has had no progressive      symptoms.  3. Request to establish followup with our primary care division:  We      will help to facilitate this.     [name], MD  Electronically Signed    SGM/MedQ  DD: 10/21/2006  DT: 10/22/2006  Job #: [id number]

## 2011-04-06 NOTE — Assessment & Plan Note (Signed)
[facility]                        [place] OFFICE NOTE   NAME:Restivo, MIKESHA MIGLIACCIO                          MRN:          [id number]  DATE:10/23/2006                            DOB:          11/28/1919    CHIEF COMPLAINT:  To discuss Aricept.   HISTORY OF PRESENT ILLNESS:  [name] is an 77 year old white female  who needs a primary care doctor.  Likes to talk about Aricept.  The  patient has been concerned about her memory for a long time.  She thinks  that she will benefit from Aricept.  I asked her to describe the  scenarios when she feels her memory is failing and she said that often  times she has difficulty finding her words.  She does not forget names,  always recognizes her family.  Her daughter states that from time to  time she can get confused on the days of the week.  She has never got  lost in town.  She lives independently with the help of a caregiver  three hours a day.  She is able to cook her own meals, take her own  showers.  Does not drive.  As far as her finances, she gets help from  her daughter.   PAST MEDICAL HISTORY:  1. COPD/pulmonary fibrosis on home O2.  2. CHF with ischemic cardiomyopathy with an ejection fraction of 35%.  3. Chronic UTIs.  4. Macular degeneration.  5. Severe osteoarthritis.  She had in the past up to three lower back      surgeries.  She also has osteoarthritis in the neck but never been      operated there.  She suffers from chronic neck pain.  6. History of mini strokes.  7. Cholecystectomy.  8. Hysterectomy and BSO.  9. Left shoulder surgery.   FAMILY HISTORY:  Her father is deceased.  He apparently had heart  disease and a question of a prostate cancer.  Mother is deceased.  She  had renal failure and heart disease.   SOCIAL HISTORY:  She quit tobacco in 1980.  She smoked a pack a day for  a long time.  No alcohol.  The patient is divorced and has three  children.   REVIEW OF SYSTEMS:   She reports no recent stroke-like symptoms.  The  last time she had a slurred speech was about five months ago.  No  nausea, vomiting, recent diplopia or headache.  Her mood, according to  the patient, is okay. The daughter, however, states that from time to  time she gets anxious and frustrated.  She believes due to her poor  vision.  She continued having back pain despite the fact that she takes  Darvocet.  She states that usually one helps very little but if she  takes two she gets loopy.  She is not taking any new medications  lately that could account for her memory except for Ambien for the last  six months.  She has been on amitriptyline for years.   MEDICATION LIST:  1. Nexium 40 one p.o. daily.  2. Amitriptyline 25 one p.o. q.h.s.  3. Cozaar 50 one p.o. daily.  4. Fosamax 70 one p.o. weekly.  5. Digoxin 0.125 one p.o. daily.  6. Lasix 40 one p.o. daily.  7. Lovastatin 10 one p.o. daily.  8. Carvedilol 6.25 b.i.d.  9. Sulindac 200 one p.o. b.i.d.  The patient used to be on Vioxx and      it helped.  After it was withdrawn from the market, she tried      Celebrex but did not like it and consequently now is on Sulindac.  10.Zolpidem 5 one p.o. q.h.s.  11.Esterase cream 0.01%.  12.Nitroglycerin p.r.n.  13.Darvocet p.r.n.   ALLERGIES:  The patient is intolerant to IMDUR.   PHYSICAL EXAMINATION:  The patient is alert, oriented, in no apparent  distress, cooperative.  Weight 143, pulse 60, respirations 16, blood  pressure 130/80.  LUNGS:  Decreased breath sounds bilaterally but no respiratory distress.  HEART:  Regular rate and rhythm with systolic murmur, I believe, which  is only 1 or 2 in intensity.  EXTREMITIES:  No edema.  ABDOMINAL EXAM:  Nondistended, soft, good bowel sounds.  No organomegaly  and no bruits.   I performed a Mini-Mental Status examination and she scored about 22 to  23.  Also I asked her to draw a clock and she did surprisingly well.    ASSESSMENT/PLAN:  1. The patient has a very mild dementia based on the Mini-Mental      Status Examination study and by physical exam.  I discussed with      the patient about Aricept.  I told her that the benefit will be      marginal and not very obvious.  I told her that like with any other      medications there may be side-effects but she is very enthusiastic      about Aricept and really like to try it despite the above      discussion.  At this point, I think it is safe to start a trial      understanding that if there is any problem we will simply stop.      Samples were given enough for five weeks.  She is going to start      with 5 mg a day and the last week take 10 mg a day.  She is to let      me know if there are any problems.  2. Today I also had the opportunity to review the notes from Dr.      Diona Browner.  Her other chronic medical problems seem to be stable.  3. I asked her to get a release of information to get the recent blood      work at [place] a few      weeks ago.  4. Office visit in four weeks.  5. Due to the Mini-Mental Status Examination this was a prolonged      visit making this a level 4.     Willow Ora, MD  Electronically Signed    JP/MedQ  DD: 10/23/2006  DT: 10/24/2006  Job #: [id number]

## 2011-04-06 NOTE — Discharge Summary (Signed)
NAMEBOBBI, YOUNT                             ACCOUNT NO.:  [id number]   MEDICAL RECORD NO.:  [id number]                   PATIENT TYPE:  INP   LOCATION:  0349                                 FACILITY:  [facility]   PHYSICIAN:  Hollice Espy, M.D.            DATE OF BIRTH:  1920-11-10   DATE OF ADMISSION:  12/28/2003  DATE OF DISCHARGE:  01/05/2004                                 DISCHARGE SUMMARY   CONSULTANTS:  1. Charlaine Dalton. Sherene Sires, M.D., pulmonary critical care.  2. Marrian Salvage. Freida Busman, M.D., [facility].   HISTORY OF PRESENT ILLNESS:  This is an 77 year old white female with a past  medical history of COPD, as well as pulmonary fibrosis and CHF, who  presented on December 28, 2003 with gradually worsening shortness of breath  and cough over the past 3 weeks, with increased progression over past 2  days.  The patient had been noticing that her cough was nonproductive.  She  has a history of fever and decreased appetite.  She denied any chest pain,  palpitations, leg or calf pain or swelling.  She had some occasional chills.  In the ED, her O2 saturation was 90% on 2 liters by nasal cannula.  Her  temperature was 100 degrees.  She was noted to have bilateral rales, and a  cardiac examination was unremarkable.  X-ray of the chest showed  interstitial lung disease.  Could not rule out a pneumonia process.  The  patient was given Zithromax and Rocephin IV, and she was further evaluated.   HOSPITAL COURSE:  The patient was transferred to the floor to a telemetry  bed following her evaluation.  She became increasingly more short of breath  and hypoxic, with her saturations dropping down to the mid-80's.  On the  same day, she was transferred to the ICU, and a CT of the chest was ordered  for further clarification.  It was felt that, given the patient's multiple  medical issues, she would best benefit from a pulmonary critical care  consult, and Dr. Sherene Sires was consulted.  Following his  evaluation, he felt that  she had acute on chronic hypoxemic respiratory failure.  Differential  diagnoses included Macrodantin pulmonary toxicity, idiopathic pulmonary  fibrosis, __________ pneumonia, and/or congestive heart failure.  A BNP was  ordered.  In addition, he also noted that she had failure to thrive and a  current urinary tract infection.  Her BNP was noted to be markedly elevated,  consistent with the history of congestive heart failure.  An echocardiogram  showed significant left ventricular dysfunction with an EF of 25% to 30%.  Dr. Trey Sailors, from [facility], was consulted, and the patient was  also noted to have an EKG showing anterolateral Q waves and precordial R  wave progress, as well as inferior Q waves.  The patient herself reported  she had no previous history of MI.  The patient's CT that was done after  admission showed chronic interstitial lung disease with idiopathic pulmonary  fibrosis, calcified nodules, and hilar lymph nodes.  It was felt that this  patient had cardiomyopathy with systolic dysfunction, and her crackles on  her physical exam were likely from fibrosis, rather than interstitial edema.  The etiology of this cardiomyopathy was felt to be unclear, but more that  likely ischemic.  Segmental wall motion abnormalities on her EKG  show__________, which were consistent with prior anterior plus or minus  inferior infarction.  The patient was treated for systolic dysfunction by  continuing on her Cozaar, and aspirin, statin, and digoxin were added  empirically, as well.  She was mildly volume overloaded, and some gentle  diuresis using Lasix was provided, as well.  Other work-up studies including  thyroid stimulating hormone, iron, and SPAP were ordered to rule out any  other type of cardiomyopathy, but again it was felt to be ischemic.  The  patient responded well to these medications.  She was overall improved.  She  continued to remain very  weak, but her breathing became more comfortable.  She diuresed quite a bit, and her oxygen levels were able to be weaned down,  and she was able to tolerate transfer to the floor.  She was felt to be  medically stable for discharge by January 05, 2004; however, due to her  continued weakness, she would need subacute care as far as for  rehabilitation.  A bed was found to be available on January 05, 2004, and  the patient was transferred to subacute.   DISCHARGE DIAGNOSES:  1. Hypoxemic respiratory failure, felt to be multifactorial secondary to     congestive heart failure and pulmonary fibrosis.  2. Congestive heart failure, with left ventricular dysfunction, poor     ejection fraction of 25% to 35%, felt to be likely ischemic.  3. History of pulmonary fibrosis.  4. History of prior myocardial infarction with akinesis.  5. Deconditioning.  6. History of recurrent urinary tract infection.  7. History of spinal injuries.   DISCHARGE MEDICATIONS WHEN TRANSFERRED TO S[facility]U:  1. Albuterol.  2. Aspirin.  3. Coreg.  4. Ceftin x4 days.  5. Digoxin.  6. Lovenox.  7. Lasix.  8. Humibid.  9. Insulin.  10.      Atrovent.  11.      Reglan.  12.      Cozaar.  13.      Protonix.  14.      Prednisone taper.  15.      Spironolactone.   DISPOSITION:  Overall, her disposition is improved.  She is being  transferred down to the subacute care unit.  She will continue to receive  her medications, as well as continue rehabilitation.   DIET:  Cardiac rehabilitation diet.   ACTIVITY:  Dictated as per the S[facility] floor.                                               Hollice Espy, M.D.    SKK/MEDQ  D:  01/22/2004  T:  01/22/2004  Job:  [id number]   cc:   Dr. Rico Sheehan(?)  [place], [place]   Maretta Bees. Vonita Moss, M.D.  [address]  [place]  [place] [address]  Fax: [phone]

## 2011-04-06 NOTE — Discharge Summary (Signed)
NAMEROSEANNE, JUENGER NO.:  [id number]   MEDICAL RECORD NO.:  [id number]                   PATIENT TYPE:  INP   LOCATION:  0349                                 FACILITY:  [facility]   PHYSICIAN:  Isla Pence, M.D.             DATE OF BIRTH:  [birth year]   DATE OF ADMISSION:  12/28/2003  DATE OF DISCHARGE:  01/05/2004                                 DISCHARGE SUMMARY   IDENTIFYING INFORMATION:  The patient was admitted to the [facility] at [facility] on December 29, 2003.  Date of admission to [facility] at  [facility] is January 05, 2004 and discharged January 14, 2004.   DISCHARGE DIAGNOSES:  1. Chronic obstructive pulmonary disease and pulmonary fibrosis with acute     on chronic hypoxia.  2. Deconditioning for which she was at the rehab.  3. History of cardiomyopathy though most likely secondary to ischemic     disease; however, she from a lung standpoint would not be able to     tolerate any further interventional workup.  She also has history of     congestive heart failure related to this.  4. Chronic back pain.  5. History of transient ischemic attack.  6. History of pulmonary fibrosis.  7. History of hypertension.  8. Degenerative joint disease.  9. Macular degeneration.  10.      Frequent urinary tract infections.  11.      Status post multiple spinal surgeries.   DISCHARGE MEDICATIONS:  1. Humibid L.A. one p.o. b.i.d.  2. Atrovent inhaler 2 puffs q.i.d.  3. Losartan 50 mg p.o. daily.  4. Reglan 10 mg p.o. q.a.c. and q.h.s.  5. Albuterol inhaler 2 puffs q.i.d.  6. Coreg 3.125 mg p.o. b.i.d.  7. Digoxin 0.125 mg p.o. daily.  8. Lasix 40 mg p.o. daily.  9. Protonix 40 mg p.o. daily.  10.      Aldactone 25 mg p.o. daily.  11.      She is on O2.  12.      I have also initiated Zocor on this lady.  I will start her out at     a very small dose of 10 mg p.o. daily since there is consideration of her     having ischemic cardiomyopathy  based on the echocardiogram results.  Her     AST and ALT were normal and therefore I will be initiating this.   The patient has been advised to just follow this list of discharge  medications and not to take any other prescribed medications that is  different from this current list.   DISCHARGE ACTIVITIES:  As tolerated.  She is going to have home PT and OT  since the patient is adamant about returning home.   DISCHARGE DIET:  Low fat, low salt.   HOSPITAL COURSE:  This patient was initially admitted to, I  believe, [facility] on the 9th of February with complaints of worsening of her baseline  shortness of breath.  In addition, she also had a cough over the past 3  weeks prior to admission but it apparently had progressed over the past 4  days prior to admission.  The shortness of breath worsened with exertion and  coughing.  It was nonproductive in nature.  She apparently had a history of  some fever and decreased appetite with nausea and occasional vomiting  without any hematemesis.  Denied any chest pain, palpitations or paroxysmal  dyspnea, leg or calf pain or swelling at that time.  She had experienced  some occasional chills.  In any case, the patient was subsequently admitted  with the COPD exacerbation with possible slight worsening of her underlying  pulmonary fibrosis also.  Chest CT was done at that time also and that  suggested chronic interstitial lung disease most consistent with idiopathic  pulmonary fibrosis with areas of honeycombing and traction bronchiectasis.  Other considerations were that of asbestosis with calcification of the right  hemidiaphragm as well as chronic hypersensitivity pneumonitis.  It was also  noted that she had some calcified mediastinal and hilar nodes.  Pulmonary  was consulted and Dr. Sherene Sires had come to see her and he had a few differential  diagnosis as part of her underlying lung disease.  There was also question  of whether she might have  some mild underlying congestive heart failure type  of picture.  Her BNP did come back elevated.  Cardiology was subsequently  consulted also and [facility] came by and saw her and thought  clinically she did not look volume overloaded and rather it was more of a  pulmonary picture but felt with her previous history of congestive heart  failure, and her not having had any cardiac catheterization in the past,  that was maybe most likely ischemic in origin.  She had an echo on the 11th  of February which showed left ventricular size as being normal, overall left  ventricular systolic function was moderately to markedly decreased.  Her EF  was ranging anywhere from 25 to 35%.  There was an aneurysmal deformity of  the entire periapical wall, akinesia of the distal septal, mid distal  inferior, and mid distal anterior wall.  The left ventricular wall was also  mildly increased.  There was no aortic valve disease.  Her estimated peak  pulmonary artery pressure was mildly to moderately increased at 41 mmHg.  It  was overall felt that this patient most likely has ischemic cardiomyopathy;  However, considering her overall medical status and the fact that probably  nothing would be amenable to intervention, cardiac catheterization was not  further recommended by the subsequent cardiologist who had followed up on  her which I agree with.  Dr wert had thought that maybe underlying prolonged reflux was also  contributing to her pulmonary fibrosis although it could also be idiopathic  considering her age although she did not have any clubbing of her fingers.  She was subsequently transferred to [facility] for rehab because of  deconditioning.  She has done fairly well with this and she is fairly  adamant about returning home on her own in spite of the fact that her  daughter is a little bit more troubled by this.  The patient, as part of her treatment, was initiated on Coreg, Aldactone, and is also on  Lasix.  Her  reflux is being treated  with Protonix and also Reglan.   The day prior to discharge her electrolytes showed a sodium of 133,  potassium of 4.6, BUN and creatinine 9 and 0.8, chloride 100, CO2 of 27,  glucose of 109, AST and ALT are normal.  Digoxin is 0.5.   It is felt that her sodium of 133 is related to the diuretic and no further  action is needed at this point in time.   The patient has told me that she prefers not to go back to see Dr. Marvis Moeller who  lives in [facility] who admits to [facility] and she prefers all of  her admissions taking place at [facility] and since Dr. Sandrea Hughs had  seen her she would like to see him as her primary care physician.  I  understand he does primary care and also pulmonary at the same time which  will be ideal for this patient.  Therefore, the patient will be given the  number for [facility] for she or her daughter to call and set up a  new patient visit with Dr. Sandrea Hughs.  At the  time of discharge, the night prior to discharge she had a low-grade  temperature of 99.5.  However, she is clinically showing no signs of active  infection and it may have been just mild atelectasis.  She denies any  worsening or actually new cough, no diarrhea or dysuria or urinary symptoms.                                                Isla Pence, M.D.    RRV/MEDQ  D:  01/14/2004  T:  01/14/2004  Job:  [id number]   cc:   Charlaine Dalton. Sherene Sires, M.D. [facility]

## 2011-04-07 LAB — CBC
HCT: 33.8 % — ABNORMAL LOW (ref 36.0–46.0)
MCH: 28.9 pg (ref 26.0–34.0)
MCV: 88 fL (ref 78.0–100.0)
RBC: 3.84 MIL/uL — ABNORMAL LOW (ref 3.87–5.11)
WBC: 16.4 10*3/uL — ABNORMAL HIGH (ref 4.0–10.5)

## 2011-04-07 LAB — BASIC METABOLIC PANEL
BUN: 16 mg/dL (ref 6–23)
Chloride: 102 mEq/L (ref 96–112)
Creatinine, Ser: 0.62 mg/dL (ref 0.4–1.2)
Glucose, Bld: 122 mg/dL — ABNORMAL HIGH (ref 70–99)
Potassium: 3.8 mEq/L (ref 3.5–5.1)

## 2011-04-08 LAB — BASIC METABOLIC PANEL
BUN: 15 mg/dL (ref 6–23)
Calcium: 9.8 mg/dL (ref 8.4–10.5)
Creatinine, Ser: 0.57 mg/dL (ref 0.4–1.2)
GFR calc non Af Amer: >60 mL/min (ref >60)
Glucose, Bld: 107 mg/dL — ABNORMAL HIGH (ref 70–99)
Potassium: 3.7 mEq/L (ref 3.5–5.1)

## 2011-04-08 LAB — CBC
HCT: 36 % (ref 36.0–46.0)
MCH: 28.2 pg (ref 26.0–34.0)
MCHC: 32.2 g/dL (ref 30.0–36.0)
MCV: 87.4 fL (ref 78.0–100.0)
Platelets: 289 10*3/uL (ref 150–400)
RDW: 14 % (ref 11.5–15.5)

## 2011-04-09 ENCOUNTER — Inpatient Hospital Stay (HOSPITAL_COMMUNITY): Payer: Medicare Other

## 2011-04-09 LAB — BASIC METABOLIC PANEL
BUN: 18 mg/dL (ref 6–23)
Calcium: 10.2 mg/dL (ref 8.4–10.5)
Chloride: 96 mEq/L (ref 96–112)
Creatinine, Ser: 0.68 mg/dL (ref 0.4–1.2)

## 2011-04-09 LAB — CBC
MCH: 28.6 pg (ref 26.0–34.0)
MCHC: 32.8 g/dL (ref 30.0–36.0)
MCV: 87.2 fL (ref 78.0–100.0)
Platelets: 310 10*3/uL (ref 150–400)
RBC: 4.23 MIL/uL (ref 3.87–5.11)

## 2011-04-10 LAB — BASIC METABOLIC PANEL
BUN: 17 mg/dL (ref 6–23)
Creatinine, Ser: 0.68 mg/dL (ref 0.4–1.2)
GFR calc non Af Amer: >60 mL/min (ref >60)
Potassium: 3.8 mEq/L (ref 3.5–5.1)

## 2011-04-10 LAB — CBC
MCH: 28.1 pg (ref 26.0–34.0)
MCV: 88 fL (ref 78.0–100.0)
Platelets: 248 10*3/uL (ref 150–400)
RDW: 13.9 % (ref 11.5–15.5)
WBC: 15.5 10*3/uL — ABNORMAL HIGH (ref 4.0–10.5)

## 2011-04-11 LAB — CBC
Platelets: 235 10*3/uL (ref 150–400)
RDW: 13.8 % (ref 11.5–15.5)
WBC: 16.9 10*3/uL — ABNORMAL HIGH (ref 4.0–10.5)

## 2011-04-11 LAB — BASIC METABOLIC PANEL
GFR calc non Af Amer: >60 mL/min (ref >60)
Potassium: 4.2 mEq/L (ref 3.5–5.1)
Sodium: 134 mEq/L — ABNORMAL LOW (ref 135–145)

## 2011-04-11 LAB — URINALYSIS, ROUTINE W REFLEX MICROSCOPIC
Nitrite: NEGATIVE
Specific Gravity, Urine: 1.017 (ref 1.005–1.030)
pH: 5 (ref 5.0–8.0)

## 2011-04-12 ENCOUNTER — Inpatient Hospital Stay (HOSPITAL_COMMUNITY): Payer: Medicare Other

## 2011-04-12 LAB — BASIC METABOLIC PANEL
CO2: 32 mEq/L (ref 19–32)
Calcium: 10 mg/dL (ref 8.4–10.5)
GFR calc Af Amer: >60 mL/min (ref >60)
Sodium: 132 mEq/L — ABNORMAL LOW (ref 135–145)

## 2011-04-12 LAB — CBC
Hemoglobin: 12.7 g/dL (ref 12.0–15.0)
MCHC: 31.6 g/dL (ref 30.0–36.0)
RBC: 4.5 MIL/uL (ref 3.87–5.11)

## 2011-04-15 NOTE — Discharge Summary (Signed)
[name], [name] NO.:  [id number]  MEDICAL RECORD NO.:  [id number]           PATIENT TYPE:  I  LOCATION:  1329                         FACILITY:  [facility]  PHYSICIAN:  Isidor Holts, M.D.  DATE OF BIRTH:  19-Apr-1920  DATE OF ADMISSION:  04/06/2011 DATE OF DISCHARGE:  04/12/2011                              DISCHARGE SUMMARY   PRIMARY MD:  Willow Ora, MD  PRIMARY NEUROLOGIST:  Blythe Stanford, DO  PRIMARY CARDIOLOGIST:  Marca Ancona, MD  DISCHARGE DIAGNOSES: 1. Bilateral community-acquired pneumonia. 2. Mild dehydration. 3. History of coronary artery disease/ischemic cardiomyopathy,     ejection fraction 25% to 30%. 4. Chronic systolic congestive heart failure. 5. Hypertension. 6. History of transient ischemic attacks. 7. Chronic obstructive pulmonary disease. 8. Pulmonary fibrosis. 9. Gastroesophageal reflux disease. 10.Macular degeneration. 11.Osteoporosis. 12.Dementia. 13.Urinary tract infection. DISCHARGE MEDICATIONS: 1. Ocuvite 1 tablet p.o. b.i.d. 2. Ensure chocolate liquid nutritional supplement 237 mL p.o. t.i.d.     p.r.n. 3. Avelox 100 mg p.o. daily for 10 days. 4. Resource wild berry liquid nutritional supplement 240 mL p.o.     t.i.d. p.r.n. 5. Tramadol 25 mg p.o. t.i.d. for 7 days only for rib pains. 6. Lasix 40 mg p.o. b.i.d. (was on 40 mg p.o. daily). 7. Ambien 5 mg at bedtime. 8. Aspirin 81 mg p.o. daily. 9. Ativan 0.5 mg p.o. p.r.n. b.i.d. for agitation. 10.Calcium carbonate/vitamin D 1 tablet p.o. b.i.d. 11.Coreg 6.25 mg p.o. b.i.d. 12.Cozaar 50 mg p.o. daily. 13.Digoxin 125 mcg p.o. q.a.m. 14.Docusate 100 mg p.o. b.i.d. 15.Elavil 25 mg p.o. at bedtime. 16.Estrace cream one application TV t.i.d. on Mondays, Wednesdays and     Fridays. 17.Flomax 0.4 mg p.o. daily. 18.Fosamax 70 mg p.o. q. weekly. 19.Guaifenesin/dextromethorphan 5 mL p.o. p.r.n. q.8 hourly for cough. 20.Hydrocodone/acetaminophen (5/325) 1 tablet p.o.  p.r.n. b.i.d. and     q.8 hourly as needed for pain. 21.I-Caps multivitamin 1 tablet p.o. b.i.d. 22.MiraLAX 17 g p.o. daily. 23.Nexium 40 mg p.o. daily. 24.Nitroglycerin patch (0.3 mg) one patch to skin daily. 25.Nitroglycerin 0.4 mg sublingually p.r.n. q.5 minutes for chest pain     x3 doses. 26.Phenergan 12.5 mg 1/2 tablet p.o. b.i.d. 27.Restasis 1 drop each eye b.i.d. 28.Simvastatin 20 mg p.o. daily. 29.Tylenol 650 mg p.o. p.r.n. q.6 hourly for pain, fever, headache. 30.Tylenol arthritis 650 mg p.o. q.a.m. NOTE:  Nitrofurantoin macrocrystal has been discontinued.  PROCEDURES: 1. Chest x-ray 04/06/2011:  This showed patchy bilateral airspace     disease which may represent pneumonia superimposed on pulmonary     fibrosis. 2. Chest x-ray 04/09/2011:  This showed slight progression of     bilateral pulmonary infiltrates. 3. Chest x-ray 04/12/2011:  This showed patchy bilateral airspace     disease likely representing pneumonia.  There was a small pleural     effusion, probable underlying emphysema.  CONSULTATIONS:  None.  ADMISSION HISTORY:  As in H&P notes of Apr 06, 2011, dictated by Dr. Andreas Blower.  However, in brief, this is an 77 year old female, with known history of coronary artery disease/ischemic cardiomyopathy, ejection fraction 25% 30%; COPD; pulmonary fibrosis; hypertension;  GERD; osteoporosis; TIAs; history of macular degeneration, presenting with a 3- day history of cough productive of whitish phlegm, followed by a fever of 102 on the day prior to presentation.  Because of progressive symptoms and fever, she was brought to the emergency department, where imaging studies demonstrated bilateral patchy airspace disease consistent with pneumonia.  She was therefore admitted for further evaluation, investigation and management.  CLINICAL COURSE: 1. Pneumonia:  The patient had bilateral community-acquired pneumonia.     She was therefore placed on intravenous  Rocephin/ azithromycin.     Clinical improvement was somewhat tardy but steady and by     04/10/2011, she was transitioned to monotherapy with Avelox.  As of     04/11/2011, repeat chest x-ray demonstrated stability of     infiltrates.  The patient felt clinically better.  She has had no     further episodes of pyrexia and phlegm was just scanty.  She was     also no longer short of breath and appetite had improved.  2. Dehydration:  The patient did have mild dehydration at the time of     presentation, BUN at that time was 19 with a creatinine of 0.78.     She was managed with gentle intravenous fluids, with resolution of     dehydration.  We were able to reinstate the patient's preadmission     diuretics, albeit at an increased dose, with no deleterious effects.  3. Chronic obstructive pulmonary disease/pulmonary fibrosis:  The     patient remained clinically stable from this viewpoint.  4. Gastroesophageal reflux disorder:  There were no problems referable     to this, on proton pump inhibitor.  5. Hypertension:  This remained controlled on preadmission     antihypertensive medications.  6. Urinary tract infection:  The patient did have a positive urinary     sediment with many bacteria consistent with urinary tract     infection.  Repeat urinalysis of 04/11/2011 showed completely     normal urinalysis, also urine cultures remained negative.  Likely     the patient had early urinary tract infection, which was cleared by     intravenous Rocephin.  DISPOSITION:  The patient as of 04/12/2011 appeared to be at baseline. There were no new issues.  She was considered clinically stable for discharge and therefore discharged accordingly.  ACTIVITY:  As tolerated.  Recommend to increase activity slowly, otherwise per PT.  DIET:  Heart-healthy.  FOLLOWUP INSTRUCTIONS: 1. The patient is to follow up routinely with her primary MD, Dr. Willow Ora, per prior scheduled appointment, but  certainly within 2 weeks     of discharge. 2. In addition, she is to follow up with her primary cardiologist per     prior scheduled appointment.     Isidor Holts, M.D.     CO/MEDQ  D:  04/12/2011  T:  04/12/2011  Job:  [id number]  cc:   Willow Ora, MD [phone] [address] [place], [place] [address]  Blythe Stanford Fax: [phone]  Darlin Priestly, MD Fax: [phone]  Electronically Signed by Isidor Holts M.D. on 04/15/2011 06:06:54 PM

## 2011-04-20 ENCOUNTER — Telehealth: Payer: Self-pay | Admitting: *Deleted

## 2011-04-20 NOTE — Telephone Encounter (Signed)
Received a fax from [facility] w/ Pts d/c meds from hospital- per Dr.Paz cont meds per d/c summary. Also asked about doing PT/OT/ST- Dr. Drue Novel ok'd all. Also advised for them to do a soft diet. Faxed all orders to [facility].

## 2011-04-24 ENCOUNTER — Encounter: Payer: Self-pay | Admitting: Internal Medicine

## 2011-04-25 ENCOUNTER — Ambulatory Visit (INDEPENDENT_AMBULATORY_CARE_PROVIDER_SITE_OTHER): Payer: Medicare Other | Admitting: Internal Medicine

## 2011-04-25 ENCOUNTER — Telehealth: Payer: Self-pay | Admitting: *Deleted

## 2011-04-25 ENCOUNTER — Encounter: Payer: Self-pay | Admitting: Internal Medicine

## 2011-04-25 ENCOUNTER — Ambulatory Visit (INDEPENDENT_AMBULATORY_CARE_PROVIDER_SITE_OTHER)
Admission: RE | Admit: 2011-04-25 | Discharge: 2011-04-25 | Disposition: A | Discharge status: Home / Self Care | Payer: Medicare Other | Source: Ambulatory Visit | Attending: Internal Medicine | Admitting: Internal Medicine

## 2011-04-25 DIAGNOSIS — K5900 Constipation, unspecified: Secondary | ICD-10-CM

## 2011-04-25 DIAGNOSIS — Z79899 Other long term (current) drug therapy: Secondary | ICD-10-CM | POA: Insufficient documentation

## 2011-04-25 DIAGNOSIS — E785 Hyperlipidemia, unspecified: Secondary | ICD-10-CM

## 2011-04-25 DIAGNOSIS — Z760 Encounter for issue of repeat prescription: Secondary | ICD-10-CM

## 2011-04-25 DIAGNOSIS — J189 Pneumonia, unspecified organism: Secondary | ICD-10-CM

## 2011-04-25 DIAGNOSIS — N390 Urinary tract infection, site not specified: Secondary | ICD-10-CM

## 2011-04-25 DIAGNOSIS — M1990 Unspecified osteoarthritis, unspecified site: Secondary | ICD-10-CM

## 2011-04-25 HISTORY — DX: Pneumonia, unspecified organism: J18.9

## 2011-04-25 HISTORY — DX: Other long term (current) drug therapy: Z79.899

## 2011-04-25 LAB — CBC WITH DIFFERENTIAL/PLATELET
Basophils Relative: 0.3 % (ref 0.0–3.0)
Eosinophils Relative: 1.6 % (ref 0.0–5.0)
Lymphocytes Relative: 13.4 % (ref 12.0–46.0)
Monocytes Relative: 8.1 % (ref 3.0–12.0)
Neutrophils Relative %: 76.6 % (ref 43.0–77.0)
RBC: 4.57 Mil/uL (ref 3.87–5.11)
WBC: 20.4 10*3/uL (ref 4.5–10.5)

## 2011-04-25 LAB — BASIC METABOLIC PANEL
BUN: 22 mg/dL (ref 6–23)
Chloride: 100 mEq/L (ref 96–112)
Glucose, Bld: 62 mg/dL — ABNORMAL LOW (ref 70–99)
Potassium: 4.6 mEq/L (ref 3.5–5.1)

## 2011-04-25 NOTE — Progress Notes (Signed)
  Subjective:    Patient ID: [name], female    DOB: [birth year], 77 y.o.   MRN: [id number]  HPI Admitted to the hospital from 5/18-5-24/12. Here with her daughter She was diagnosed with bilateral community-acquired pneumonia, she was discharged on Avelox for 10 days along with several other medicines. My nurse went through  her meds and fortunately she is taking them as Rx per the d/c summary. Of notice, she is not taking Ultram or Zocor. Macrodantin prescribed for  recurrent UTIs was also discontinued. Chart is reviewed, last chest x-ray was 5/24 and still show patchy bilateral airspace disease, small pleural effusion. 5/24 WBC 17.6, hemoglobin 12.7, platelets 204. potassium of 3.8, sodium 132 and creatinine 0.7     Past Medical History: Reviewed history from 10/16/2010 and no changes required. Ischemic cardiomyopathy.  This was presumed based on EKG and echocardiogram.  Her EKG shows evidence for an old anterior MI. Most recent echo in 1/11 with EF 30%, apical aneurysm, mild LVH, grade I diastolic dysfunction, mild MR, no AS.   Mild aspiration per swallow test 1-11 Pulmonary fibrosis--COPD  Patient was on home O2 at one point at night   There is a question of whether her pulmonary fibrosis was triggered as an adverse reaction to Macrodantin. Chronic UTIs, Sepsis- UTI w/ MS changes 10-09 Status post left hip fracture with pinning in September 2009. History of falls. Possible TIAs. GERD Macular degeneration-- L blindness,  R: peripheral vision only Hypercholesterolemia. DEMENTIA, SENILE, UNCOMPLICATED   Osteoarthritis, chronic neck pain   Past Surgical History: Reviewed history from 08/12/2009 and no changes required. Cholecystectomy Hysterectomy Oophorectomy Spinal fusion,x 3 ,  lower spine hip fracture 07-2008   Review of Systems ROS obtained from the patient and daughter Since she left the hospital, she continued to be extremely weak. No fever or chills. Shortness of  breath still there, not yet at baseline. Denies any wheezing, still cough from time to time but less than before. No chest pain except for some soreness at the left side of the chest with cough. Occasional nausea, no vomiting. She is also having diarrhea, the daughter reports that she is getting MiraLax daily and is not eating well. Appetite is decreased, she "forces herself to eat"    Objective:   Physical Exam  Constitutional:       77 year old lady in no acute distress but looks weaker than on previous visits. O2 sat 98% on room air.  Cardiovascular:       Systolic murmur present otherwise normal  Pulmonary/Chest:       Dry crackles at bases, rest of the fields with decreased breath sounds. No increased work of breathing at rest  Abdominal: Soft. There is no tenderness.  Musculoskeletal: She exhibits no edema.  Neurological: She is alert.  Psychiatric: She has a normal mood and affect. Her behavior is normal. Judgment and thought content normal.          Assessment & Plan:

## 2011-04-25 NOTE — Assessment & Plan Note (Signed)
History of recurring UTIs, macrobid  worked well for her. Urinalyses 5/23 @ the hospital negative. Plan: Restart Macrobid Bladder catheterization 3 times a week

## 2011-04-25 NOTE — Assessment & Plan Note (Addendum)
She is obviously taking multiple medicines, I agree with current list, if anything I would like to cut either Elavil or Ambien however she is doing well and get her off Elavil will be very difficult. She has been taken that for years.

## 2011-04-25 NOTE — Assessment & Plan Note (Addendum)
On MiraLax daily, having diarrhea lately. Will recommend to d/c  MiraLax and colace as needed only, not a standing order

## 2011-04-25 NOTE — Patient Instructions (Addendum)
Please read the medication list carefuly: discontinue MiraLax, Colace only as needed,  Restart Macrodantin 50 mg daily. Come back in 3 weeks, call anytime if symptoms worse, fever, shortness of breath, swelling in the legs. The patient also needs to have a bladder catheterization 3 times a week

## 2011-04-25 NOTE — Assessment & Plan Note (Signed)
Off Ultram, now on hydrocodone

## 2011-04-25 NOTE — Telephone Encounter (Signed)
Addendum I spoke with the nursing home, I did request a urine culture and 2 blood cultures. I also spoke with Geraldine Contras the patient's daughter at [phone]  (cell phone [phone]). Make sure were then made WBCs are elevated, I asked her to keep an eye on her mother and if she feels that she gets worse, she will call me or take her to the ER

## 2011-04-25 NOTE — Assessment & Plan Note (Addendum)
Of simvastatin, given polypharmacy  I agree with discontinuation of statins

## 2011-04-25 NOTE — Telephone Encounter (Signed)
Phone call, WBCs elevated, rest of the labs pending. Chest x-ray showed improvement in aeration. Plan: Wait for the rest of the labs, will check on the patient tomorrow, consider urine culture, blood cultures if possible and re start antibiotics.

## 2011-04-25 NOTE — Assessment & Plan Note (Signed)
77 year old lady with multiple medical problems including pulmonary fibrosis, status post a bilateral pneumonia recently, still very weak. She does not seem to be volume overloaded, no respiratory distress at rest. Plan: Chest x-ray Labs Reassess in 2 or 3 weeks.

## 2011-04-25 NOTE — Telephone Encounter (Signed)
Pts WBC is 20.4

## 2011-04-26 MED ORDER — LEVOFLOXACIN 250 MG PO TABS: ORAL_TABLET | ORAL | Status: AC

## 2011-04-26 NOTE — Telephone Encounter (Signed)
Besides the elevated white count, CBC and BMP are normal. Please call [facility]: Levaquin 250 mg 1 by mouth daily for 10 days Please be sure the blood cultures and urine cultures were taken before she start antibiotics Call or take the patient to the ER if fever or clinical deterioration in the next few days. Please speak with the daughter, pt  needs to come back in one week

## 2011-04-26 NOTE — Telephone Encounter (Signed)
Spoke w/ pt daughter aware of information.

## 2011-04-26 NOTE — Telephone Encounter (Signed)
Spoke w/ Misty Stanley at [facility] assisted living- they are aware. Will fax over order for Levaquin. Left message for pts daughter to call back.

## 2011-05-03 ENCOUNTER — Encounter: Payer: Self-pay | Admitting: Internal Medicine

## 2011-05-03 ENCOUNTER — Ambulatory Visit (INDEPENDENT_AMBULATORY_CARE_PROVIDER_SITE_OTHER): Payer: Medicare Other | Admitting: Internal Medicine

## 2011-05-03 DIAGNOSIS — J189 Pneumonia, unspecified organism: Secondary | ICD-10-CM

## 2011-05-03 DIAGNOSIS — M1990 Unspecified osteoarthritis, unspecified site: Secondary | ICD-10-CM

## 2011-05-03 DIAGNOSIS — K5900 Constipation, unspecified: Secondary | ICD-10-CM

## 2011-05-03 DIAGNOSIS — N390 Urinary tract infection, site not specified: Secondary | ICD-10-CM

## 2011-05-03 NOTE — Assessment & Plan Note (Addendum)
The patient had bilateral pneumonia, on followup last week she was still really  weak and the WBCs were elevated. She was Rx  an additional round of Levaquin, today she looks and feels great. Declined to  repeat a CBC today. Plan: Reassess in 2 months, patient's daughter will let me know if she has further problems.

## 2011-05-03 NOTE — Assessment & Plan Note (Signed)
Patient was doing great with hydrocodone 3 times a day, currently on 2 times a day. Plan: Increase hydrocodone to 3 times a day, see medication list

## 2011-05-03 NOTE — Assessment & Plan Note (Signed)
Patient complained of constipation today, we are restarting MiraLax

## 2011-05-03 NOTE — Progress Notes (Signed)
  Subjective:    Patient ID: [name], female    DOB: 04-17-1920, 77 y.o.   MRN: [id number]  HPI Followup from the last office visit, here with her daughter. Feeling great  Past Medical History  Diagnosis Date  . Ischemic cardiomyopathy     This was presumed based on EKG and echocardiogram. Her EKG shows evidence for an old anterior MI. 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  Marland Kitchen Pulmonary fibrosis     COPD pt was on home 02 at one point at night  . Chronic UTI      sepsis- UTI w/ MS changes 10/09  . Hip fracture     s/p w/ pinning in Sept 2009  . Fall   . TIA (transient ischemic attack)   . GERD (gastroesophageal reflux disease)   . Macular degeneration     L blindness, R: Peripheral vision only  . Hypercholesterolemia   . Dementia     senile, uncomplicated   . Osteoarthritis     chronic neck pain   Past Surgical History  Procedure Date  . Cholecystectomy   . Abdominal hysterectomy   . Oophorectomy   . Spinal fusion     x 3, lower spine  . Hip fracture surgery 07/2008     Review of Systems Her energy has improved significantly Still get short of breath rarely, sometimes get oxygen during the daytime. The last time she was having diarrhea, we held MiraLax and now she is constipated again. Denies any fevers Good compliance with Levaquin    Objective:   Physical Exam Alert, very energetic, back to baseline. Looks much better today. Lungs: Dry crackles at bases, otherwise good air movement, no respiratory distress at rest Lower extremities without edema.       Assessment & Plan:

## 2011-05-03 NOTE — Assessment & Plan Note (Addendum)
Written instructions to ALF (per pt's daughter request): asked them to fax me a report of the  3 times a week catheterizations they do for the patient

## 2011-05-08 ENCOUNTER — Encounter: Payer: Self-pay | Admitting: Internal Medicine

## 2011-05-22 ENCOUNTER — Encounter: Payer: Self-pay | Admitting: Internal Medicine

## 2011-07-19 ENCOUNTER — Ambulatory Visit: Payer: Medicare Other | Admitting: Internal Medicine

## 2011-08-07 ENCOUNTER — Encounter: Payer: Self-pay | Admitting: Internal Medicine

## 2011-08-07 DIAGNOSIS — F0390 Unspecified dementia without behavioral disturbance: Secondary | ICD-10-CM

## 2011-08-09 ENCOUNTER — Encounter: Payer: Self-pay | Admitting: Internal Medicine

## 2011-08-09 ENCOUNTER — Ambulatory Visit (INDEPENDENT_AMBULATORY_CARE_PROVIDER_SITE_OTHER): Payer: Medicare Other | Admitting: Internal Medicine

## 2011-08-09 ENCOUNTER — Ambulatory Visit (INDEPENDENT_AMBULATORY_CARE_PROVIDER_SITE_OTHER)
Admission: RE | Admit: 2011-08-09 | Discharge: 2011-08-09 | Disposition: A | Discharge status: Home / Self Care | Payer: Medicare Other | Source: Ambulatory Visit | Attending: Internal Medicine | Admitting: Internal Medicine

## 2011-08-09 DIAGNOSIS — I509 Heart failure, unspecified: Secondary | ICD-10-CM

## 2011-08-09 DIAGNOSIS — J189 Pneumonia, unspecified organism: Secondary | ICD-10-CM

## 2011-08-09 DIAGNOSIS — N390 Urinary tract infection, site not specified: Secondary | ICD-10-CM

## 2011-08-09 NOTE — Assessment & Plan Note (Addendum)
Seems stable Check a BMP

## 2011-08-09 NOTE — Progress Notes (Signed)
  Subjective:    Patient ID: [name], female    DOB: 15-May-1920, 77 y.o.   MRN: [id number]  HPI ROV, here with her daughter, seems to be doing very well, no complaints, medications list reviewed, and good compliance Past Medical History  Diagnosis Date  . Ischemic cardiomyopathy     This was presumed based on EKG and echocardiogram. Her EKG shows evidence for an old anterior MI. 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  Marland Kitchen Pulmonary fibrosis     COPD pt was on home 02 at one point at night  . Chronic UTI      sepsis- UTI w/ MS changes 10/09  . Hip fracture     s/p w/ pinning in Sept 2009  . Fall   . TIA (transient ischemic attack)   . GERD (gastroesophageal reflux disease)   . Macular degeneration     L blindness, R: Peripheral vision only  . Hypercholesterolemia   . Dementia     senile, uncomplicated   . Osteoarthritis     chronic neck pain  . Ischemic cardiomyopathy     Presumed based on EKG and echocardiogram; EKG shows evidence for old anterior MY  . History of echocardiogram 1/11    EF 30%, apical aneurysm, mild LVH, grade I diastolic dysfunction, mild MR, no AS  . Bilateral pneumonia 04/25/2011   Past Surgical History  Procedure Date  . Cholecystectomy   . Abdominal hysterectomy   . Oophorectomy   . Spinal fusion     x 3, lower spine  . Hip fracture surgery 07/2008     Review of Systems No chest pain, occasional shortness of breath, she uses oxygen as needed. No nausea, vomiting or diarrhea No dysuria or gross hematuria Good compliance with bladder catheterization 3 times a week. Next    Objective:   Physical Exam  Alert, , no apparent distress, seems to be in good spirits. Lungs: Dry crackles at bases, otherwise good air movement, no respiratory distress at rest  Cardiovascular: Regular rate and rhythm Lower extremities without edema.       Assessment & Plan:

## 2011-08-09 NOTE — Patient Instructions (Signed)
Continue same meds  Get a flu shot by October

## 2011-08-09 NOTE — Assessment & Plan Note (Addendum)
asx  on daily ABX, premarin cream and bladder cath  3 times a week

## 2011-08-09 NOTE — Assessment & Plan Note (Signed)
Clinically resolved F/u CXR and CBC  today

## 2011-08-16 ENCOUNTER — Telehealth: Payer: Self-pay | Admitting: *Deleted

## 2011-08-16 NOTE — Telephone Encounter (Signed)
Message copied by Regis Bill on Thu Aug 16, 2011  5:39 PM ------      Message from: Wanda Plump      Created: Tue Aug 14, 2011 10:15 AM       Advise patient or  daughter, x-ray ok

## 2011-08-16 NOTE — Telephone Encounter (Signed)
[facility] at [facility] To inform patient. Results mailed.

## 2011-08-18 NOTE — Telephone Encounter (Signed)
Pt refused to have labs at time of the OV 08-09-11 [facility]

## 2011-08-20 LAB — BASIC METABOLIC PANEL
BUN: 17
BUN: 4 — ABNORMAL LOW
BUN: 4 — ABNORMAL LOW
CO2: 26
CO2: 25
CO2: 26
Calcium: 9.1
Calcium: 9.1
Calcium: 9.8
Chloride: 100
Chloride: 102
Chloride: 103
Chloride: 101
Chloride: 101
Chloride: 101
Chloride: 104
Creatinine, Ser: 0.55
Creatinine, Ser: 0.5
Creatinine, Ser: 0.56
Creatinine, Ser: 0.57
GFR calc Af Amer: >60
GFR calc Af Amer: >60
GFR calc Af Amer: >60
GFR calc Af Amer: >60
GFR calc Af Amer: >60
GFR calc non Af Amer: >60
GFR calc non Af Amer: >60
GFR calc non Af Amer: >60
Glucose, Bld: 123 — ABNORMAL HIGH
Glucose, Bld: 125 — ABNORMAL HIGH
Glucose, Bld: 101 — ABNORMAL HIGH
Glucose, Bld: 99
Potassium: 3.9
Potassium: 4.1
Potassium: 3.9
Potassium: 4
Potassium: 4.2
Sodium: 131 — ABNORMAL LOW
Sodium: 134 — ABNORMAL LOW
Sodium: 134 — ABNORMAL LOW

## 2011-08-20 LAB — CBC
HCT: 34.4 — ABNORMAL LOW
HCT: 35.6 — ABNORMAL LOW
HCT: 33.9 — ABNORMAL LOW
HCT: 34.8 — ABNORMAL LOW
Hemoglobin: 11.7 — ABNORMAL LOW
Hemoglobin: 13.1
Hemoglobin: 14.3
MCHC: 33.1
MCHC: 33.3
MCV: 92.6
MCV: 92.9
MCV: 92.7
MCV: 92.7
MCV: 93.4
Platelets: 210
Platelets: 207
RBC: 4.2
RBC: 3.63 — ABNORMAL LOW
RBC: 3.75 — ABNORMAL LOW
RBC: 3.81 — ABNORMAL LOW
RBC: 4.67
RDW: 13.9
RDW: 14.1
RDW: 14.5
WBC: 15.4 — ABNORMAL HIGH
WBC: 18.1 — ABNORMAL HIGH
WBC: 10.3
WBC: 8.5
WBC: 8.9

## 2011-08-20 LAB — URINE CULTURE: Colony Count: >=100000

## 2011-08-20 LAB — POCT I-STAT, CHEM 8
BUN: 14
Creatinine, Ser: 0.8
Hemoglobin: 15
Potassium: 3.7
Sodium: 131 — ABNORMAL LOW

## 2011-08-20 LAB — CLOSTRIDIUM DIFFICILE EIA: C difficile Toxins A+B, EIA: 2

## 2011-08-20 LAB — CULTURE, BLOOD (ROUTINE X 2)

## 2011-08-20 LAB — B-NATRIURETIC PEPTIDE (CONVERTED LAB)
Pro B Natriuretic peptide (BNP): 171 — ABNORMAL HIGH
Pro B Natriuretic peptide (BNP): 281 — ABNORMAL HIGH
Pro B Natriuretic peptide (BNP): 802 — ABNORMAL HIGH

## 2011-08-20 LAB — DIFFERENTIAL
Basophils Relative: 0
Lymphocytes Relative: 10 — ABNORMAL LOW

## 2011-08-20 LAB — URINALYSIS, ROUTINE W REFLEX MICROSCOPIC
Bilirubin Urine: NEGATIVE
Ketones, ur: NEGATIVE
Nitrite: NEGATIVE
Protein, ur: NEGATIVE
Urobilinogen, UA: 0.2

## 2011-08-20 LAB — DIGOXIN LEVEL: Digoxin Level: 0.5 — ABNORMAL LOW

## 2011-08-22 LAB — CROSSMATCH: ABO/RH(D): A POS

## 2011-08-22 LAB — CBC
MCHC: 34.2
MCV: 92.1
Platelets: 232
Platelets: 267
RDW: 14.1
RDW: 14.2

## 2011-08-22 LAB — URINE CULTURE

## 2011-08-22 LAB — DIFFERENTIAL
Basophils Absolute: 0
Eosinophils Absolute: 0
Lymphocytes Relative: 5 — ABNORMAL LOW
Lymphs Abs: 1.3
Monocytes Absolute: 0.3
Monocytes Relative: 3
Neutrophils Relative %: 95 — ABNORMAL HIGH
WBC Morphology: INCREASED

## 2011-08-22 LAB — URINALYSIS, ROUTINE W REFLEX MICROSCOPIC
Bilirubin Urine: NEGATIVE
Nitrite: NEGATIVE
Specific Gravity, Urine: 1.023
Urobilinogen, UA: 0.2
pH: 6.5

## 2011-08-22 LAB — ABO/RH: ABO/RH(D): A POS

## 2011-08-22 LAB — BASIC METABOLIC PANEL
BUN: 17
Calcium: 9.2
Creatinine, Ser: 0.68
GFR calc non Af Amer: >60
Glucose, Bld: 114 — ABNORMAL HIGH

## 2011-08-22 LAB — COMPREHENSIVE METABOLIC PANEL
ALT: 28
AST: 27
Calcium: 9.9
Creatinine, Ser: 0.85
GFR calc Af Amer: >60
GFR calc non Af Amer: >60
Sodium: 130 — ABNORMAL LOW
Total Protein: 6.5

## 2011-08-22 LAB — URINE MICROSCOPIC-ADD ON

## 2011-08-22 LAB — APTT: aPTT: 34

## 2011-08-24 ENCOUNTER — Telehealth: Payer: Self-pay | Admitting: Internal Medicine

## 2011-08-24 NOTE — Telephone Encounter (Signed)
Form was brought to the office by Beckey Downing from [employer] she is requesting the original form with the Dr. Lenox Ponds . Please mail this form to the address on the envelope that came with the patient's paper work .

## 2011-10-03 ENCOUNTER — Ambulatory Visit (INDEPENDENT_AMBULATORY_CARE_PROVIDER_SITE_OTHER): Payer: Medicare Other | Admitting: Internal Medicine

## 2011-10-03 ENCOUNTER — Encounter: Payer: Self-pay | Admitting: Internal Medicine

## 2011-10-03 VITALS — BP 138/60 | HR 89 | Temp 98.9°F | Wt 140.6 lb

## 2011-10-03 DIAGNOSIS — N3090 Cystitis, unspecified without hematuria: Secondary | ICD-10-CM

## 2011-10-03 DIAGNOSIS — N390 Urinary tract infection, site not specified: Secondary | ICD-10-CM

## 2011-10-03 DIAGNOSIS — M1990 Unspecified osteoarthritis, unspecified site: Secondary | ICD-10-CM

## 2011-10-03 LAB — POCT URINALYSIS DIPSTICK
Glucose, UA: NEGATIVE
Ketones, UA: NEGATIVE
Spec Grav, UA: 1.01
Urobilinogen, UA: NEGATIVE

## 2011-10-03 NOTE — Patient Instructions (Signed)
norco (hydrocodone) 3 times a day, ok to use tylenol 500 mg twice a day

## 2011-10-03 NOTE — Assessment & Plan Note (Signed)
On chronic antibiotics, patient requested a urinalysis, dip +, UCX pending

## 2011-10-03 NOTE — Progress Notes (Signed)
Subjective:    Patient ID: [name], female    DOB: May 25, 1920, 77 y.o.   MRN: [id number]  HPI Her chief complaint today is pain that goes from the buttocks down to the posterior aspect of her tights, is worse if she sits for long time, and also after she starts walking. Would like her urine checked, history of chronic UTIs  Past Medical History  Diagnosis Date  . Ischemic cardiomyopathy     This was presumed based on EKG and echocardiogram. Her EKG shows evidence for an old anterior MI. 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  Marland Kitchen Pulmonary fibrosis     COPD pt was on home 02 at one point at night  . Chronic UTI      sepsis- UTI w/ MS changes 10/09  . Hip fracture     s/p w/ pinning in Sept 2009  . Fall   . TIA (transient ischemic attack)   . GERD (gastroesophageal reflux disease)   . Macular degeneration     L blindness, R: Peripheral vision only  . Hypercholesterolemia   . Dementia     senile, uncomplicated   . Osteoarthritis     chronic neck pain  . Ischemic cardiomyopathy     Presumed based on EKG and echocardiogram; EKG shows evidence for old anterior MY  . History of echocardiogram 1/11    EF 30%, apical aneurysm, mild LVH, grade I diastolic dysfunction, mild MR, no AS  . Bilateral pneumonia 04/25/2011  . CONGESTIVE HEART FAILURE 05/12/2007  . CONSTIPATION 11/07/2009  . COPD 05/12/2007  . CORONARY ARTERY DISEASE 08/01/2008  . Cough 09/29/2008  . DEMENTIA, SENILE, UNCOMPLICATED 08/15/2007  . DYSPHAGIA UNSPECIFIED 11/07/2009  . GAIT DISTURBANCE 08/17/2010  . GERD 12/31/2007  . HYPERLIPIDEMIA 05/22/2007  . INSOMNIA-SLEEP DISORDER-UNSPEC 04/28/2008  . MACULAR DEGENERATION 05/22/2007  . NECK PAIN, CHRONIC 08/12/2009  . OSTEOARTHRITIS 05/12/2007  . OTHER NONSPECIFIC FINDING EXAMINATION OF URINE 08/29/2009  . Polypharmacy 04/25/2011  . PRURITUS 05/30/2009  . PULMONARY FIBROSIS 05/22/2007  . SKIN LESION 01/27/2010    . SYSTOLIC MURMUR 11/07/2009  . TRANSIENT ISCHEMIC ATTACKS, HX OF 08/12/2009  . URINARY TRACT INFECTION, RECURRENT 05/22/2007  . VERTEBRAL FRACTURE 06/20/2010  . WEIGHT LOSS 05/30/2009   History   Social History  . Marital Status: Divorced    Spouse Name: N/A    Number of Children: N/A  . Years of Education: N/A   Occupational History  . Not on file.   Social History Main Topics  . Smoking status: Former Smoker -- 30 years    Types: Cigarettes    Quit date: 11/19/1978  . Smokeless tobacco: Not on file   Comment: smoked for 30 years  . Alcohol Use: No  . Drug Use: No  . Sexually Active: Not on file   Other Topics Concern  . Not on file   Social History Narrative   Resident of [facility]----[facility] ---Daughter Dione Booze lives in [place]----Gets help w/ all meds--Independent on ADLs    Review of Systems No lower extremity edema Headache is at baseline No fever No dysuria or gross hematuria, occ. her urine is dark.     Objective:   Physical Exam  Constitutional: She is oriented to person, place, and time. She appears well-developed and well-nourished. No distress.  Abdominal: Soft. She exhibits no distension. There is no tenderness. There is no rebound and no guarding.  Musculoskeletal: She exhibits no edema.       Normal femoral and pedal pulses bilaterally. Not tender at the trochanteric bursa, range of motion of the hips normal  Neurological: She is alert and oriented to person, place, and time.       Motor exam and DTRs symmetric in the lower extremities  Skin: She is not diaphoretic.      Assessment & Plan:

## 2011-10-03 NOTE — Assessment & Plan Note (Addendum)
Presents with pain at the buttocks  with radiation to the posterior aspect of the legs, MRI of the back 06-2010 showed DJD. She sees Dr. Ethelene Hal for local injections, recommend to see him , back pain may be related to spinal stenosis

## 2011-10-05 LAB — URINE CULTURE: Colony Count: 30000

## 2011-10-08 ENCOUNTER — Telehealth: Payer: Self-pay

## 2011-10-08 NOTE — Telephone Encounter (Signed)
Message copied by Beverely Low on Mon Oct 08, 2011  2:16 PM ------      Message from: Willow Ora E      Created: Mon Oct 08, 2011 10:06 AM       Advise patient, urine culture was negative, continue with present medicines

## 2011-10-08 NOTE — Telephone Encounter (Signed)
Left message to notify pt of Ucx results

## 2011-11-27 ENCOUNTER — Emergency Department (HOSPITAL_COMMUNITY): Payer: Medicare Other

## 2011-11-27 ENCOUNTER — Emergency Department (HOSPITAL_COMMUNITY)
Admission: EM | Admit: 2011-11-27 | Discharge: 2011-11-27 | Disposition: A | Discharge status: Home / Self Care | Payer: Medicare Other | Attending: Emergency Medicine | Admitting: Emergency Medicine

## 2011-11-27 ENCOUNTER — Encounter (HOSPITAL_COMMUNITY): Payer: Self-pay | Admitting: Emergency Medicine

## 2011-11-27 ENCOUNTER — Telehealth: Payer: Self-pay

## 2011-11-27 DIAGNOSIS — I2510 Atherosclerotic heart disease of native coronary artery without angina pectoris: Secondary | ICD-10-CM | POA: Insufficient documentation

## 2011-11-27 DIAGNOSIS — F0390 Unspecified dementia without behavioral disturbance: Secondary | ICD-10-CM | POA: Insufficient documentation

## 2011-11-27 DIAGNOSIS — R0602 Shortness of breath: Secondary | ICD-10-CM | POA: Insufficient documentation

## 2011-11-27 DIAGNOSIS — Z8673 Personal history of transient ischemic attack (TIA), and cerebral infarction without residual deficits: Secondary | ICD-10-CM | POA: Insufficient documentation

## 2011-11-27 DIAGNOSIS — J40 Bronchitis, not specified as acute or chronic: Secondary | ICD-10-CM | POA: Insufficient documentation

## 2011-11-27 DIAGNOSIS — R0789 Other chest pain: Secondary | ICD-10-CM | POA: Insufficient documentation

## 2011-11-27 MED ORDER — IPRATROPIUM BROMIDE 0.02 % IN SOLN
0.5000 mg | Freq: Once | RESPIRATORY_TRACT | Status: AC
  Administered 2011-11-27: 0.5 mg via RESPIRATORY_TRACT
  Filled 2011-11-27: qty 2.5

## 2011-11-27 MED ORDER — ALBUTEROL SULFATE (5 MG/ML) 0.5% IN NEBU
5.0000 mg | INHALATION_SOLUTION | Freq: Once | RESPIRATORY_TRACT | Status: AC
  Administered 2011-11-27: 5 mg via RESPIRATORY_TRACT
  Filled 2011-11-27: qty 1

## 2011-11-27 MED ORDER — DOXYCYCLINE HYCLATE 100 MG PO CAPS: 100.0000 mg | ORAL_CAPSULE | Freq: Two times a day (BID) | ORAL | Status: AC

## 2011-11-27 NOTE — Telephone Encounter (Signed)
Discuss with Misty Stanley at assisting living will contact Pt daughter and advise her of Dr Drue Novel suggestion.

## 2011-11-27 NOTE — Telephone Encounter (Signed)
She really needs to be seen. 1. I recommend a ER evaluation  2. If the family refuses/declines, then is okay to do a chest x-ray (diagnosis for the XR depend  on her symptoms: Fever, cough?)

## 2011-11-27 NOTE — Telephone Encounter (Signed)
[name] from [facility] called and states pt has been sick over the last couple of days and seems very weak when she attempts to talk.  Assisted Living is requesting an order for a chest x-ray. Please fax to [phone]. Pls advise.

## 2011-11-27 NOTE — ED Provider Notes (Signed)
History     CSN: [id number]  Arrival date & time 11/27/11  1449   First MD Initiated Contact with Patient 11/27/11 1928      Chief Complaint  Patient presents with  . Shortness of Breath    pt sent to ed by pcp for shortness of breath times 2 days. pt also c/o cough and tightness in her chest.    (Consider location/radiation/quality/duration/timing/severity/associated sxs/prior treatment) HPI Comments: Patient presents complaining of one week's worth of some increased cough and some mild increase in shortness of breath.  Patient thought that it was just a bronchitis type infection that was improving at home.  She actually notes that over the last few days she has improved.  She is coming today on the recommendation of her primary care physician.  She denies any specific fevers at home.  She's been using her home oxygen at night as directed and intermittently during the day when she feels she needs it.  She has not been using her albuterol at all.  Patient denies any chest pain, nausea, vomiting.  Patient has been able to participate in all of her daily activities at her assisted living facility as well which includes exercise classes as well as other craft classes.  Patient is a 78 y.o. female presenting with shortness of breath. The history is provided by the patient. No language interpreter was used.  Shortness of Breath  The current episode started more than 1 week ago. The onset was gradual. The problem occurs occasionally. The problem has been gradually improving. Associated symptoms include cough and shortness of breath. Pertinent negatives include no chest pain and no fever.    Past Medical History  Diagnosis Date  . Ischemic cardiomyopathy     This was presumed based on EKG and echocardiogram. Her EKG shows evidence for an old anterior MI. 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  Marland Kitchen Pulmonary fibrosis     COPD pt  was on home 02 at one point at night  . Chronic UTI      sepsis- UTI w/ MS changes 10/09  . Hip fracture     s/p w/ pinning in Sept 2009  . Fall   . TIA (transient ischemic attack)   . GERD (gastroesophageal reflux disease)   . Macular degeneration     L blindness, R: Peripheral vision only  . Hypercholesterolemia   . Dementia     senile, uncomplicated   . Osteoarthritis     chronic neck pain  . Ischemic cardiomyopathy     Presumed based on EKG and echocardiogram; EKG shows evidence for old anterior MY  . History of echocardiogram 1/11    EF 30%, apical aneurysm, mild LVH, grade I diastolic dysfunction, mild MR, no AS  . Bilateral pneumonia 04/25/2011  . CONGESTIVE HEART FAILURE 05/12/2007  . CONSTIPATION 11/07/2009  . COPD 05/12/2007  . CORONARY ARTERY DISEASE 08/01/2008  . Cough 09/29/2008  . DEMENTIA, SENILE, UNCOMPLICATED 08/15/2007  . DYSPHAGIA UNSPECIFIED 11/07/2009  . GAIT DISTURBANCE 08/17/2010  . GERD 12/31/2007  . HYPERLIPIDEMIA 05/22/2007  . INSOMNIA-SLEEP DISORDER-UNSPEC 04/28/2008  . MACULAR DEGENERATION 05/22/2007  . NECK PAIN, CHRONIC 08/12/2009  . OSTEOARTHRITIS 05/12/2007  . OTHER NONSPECIFIC FINDING EXAMINATION OF URINE 08/29/2009  . Polypharmacy 04/25/2011  . PRURITUS 05/30/2009  . PULMONARY FIBROSIS 05/22/2007  . SKIN LESION 01/27/2010  . SYSTOLIC MURMUR 11/07/2009  . TRANSIENT ISCHEMIC ATTACKS, HX OF 08/12/2009  .  URINARY TRACT INFECTION, RECURRENT 05/22/2007  . VERTEBRAL FRACTURE 06/20/2010  . WEIGHT LOSS 05/30/2009    Past Surgical History  Procedure Date  . Cholecystectomy   . Abdominal hysterectomy   . Oophorectomy   . Spinal fusion     x 3, lower spine  . Hip fracture surgery 07/2008    Family History  Problem Relation Age of Onset  . Heart disease Other     History  Substance Use Topics  . Smoking status: Former Smoker -- 30 years    Types: Cigarettes    Quit date: 11/19/1978  . Smokeless tobacco: Not on file   Comment: smoked for 30 years  . Alcohol  Use: No    OB History    Grav Para Term Preterm Abortions TAB SAB Ect Mult Living                  Review of Systems  Constitutional: Negative.  Negative for fever and chills.  HENT: Negative.   Eyes: Negative.  Negative for discharge and redness.  Respiratory: Positive for cough and shortness of breath.   Cardiovascular: Negative.  Negative for chest pain.  Gastrointestinal: Negative.  Negative for nausea, vomiting, abdominal pain and diarrhea.  Genitourinary: Negative.  Negative for dysuria and vaginal discharge.  Musculoskeletal: Negative.  Negative for back pain.  Skin: Negative.  Negative for color change and rash.  Neurological: Negative.  Negative for syncope and headaches.  Hematological: Negative.  Negative for adenopathy.  Psychiatric/Behavioral: Negative.  Negative for confusion.  All other systems reviewed and are negative.    Allergies  Isosorbide mononitrate  Home Medications   Current Outpatient Rx  Name Route Sig Dispense Refill  . ACETAMINOPHEN ER 650 MG PO TBCR Oral Take 650 mg by mouth every morning.      . ALENDRONATE SODIUM 70 MG PO TABS Oral Take 70 mg by mouth every 7 (seven) days. Take with a full glass of water on an empty stomach.     . AMITRIPTYLINE HCL 25 MG PO TABS Oral Take 25 mg by mouth at bedtime.      . ASPIRIN 81 MG PO TBEC Oral Take 81 mg by mouth daily.      . OCUVITE PO TABS Oral Take 1 tablet by mouth 2 (two) times daily.      Marland Kitchen CALCIUM-VITAMIN D PO Oral Take by mouth 2 (two) times daily.      Marland Kitchen CARVEDILOL 6.25 MG PO TABS Oral Take 6.25 mg by mouth 2 (two) times daily with a meal.      . CYCLOSPORINE 0.05 % OP EMUL Both Eyes Place 1 drop into both eyes 2 (two) times daily.      Marland Kitchen DIGOXIN 0.125 MG PO TABS Oral Take 125 mcg by mouth daily.      Marland Kitchen DOCUSATE SODIUM 100 MG PO CAPS Oral Take by mouth. 1 a day as needed    . ESOMEPRAZOLE MAGNESIUM 40 MG PO CPDR Oral Take 40 mg by mouth daily before breakfast.      . ESTRADIOL 0.1 MG/GM VA  CREA Vaginal Place 2 g vaginally as directed.      . FUROSEMIDE 40 MG PO TABS Oral Take 40 mg by mouth 2 (two) times daily.      . GUAIFENESIN 100 MG/5ML PO LIQD Oral Take 200 mg by mouth every 8 (eight) hours.      Marland Kitchen HYDROCODONE-ACETAMINOPHEN 5-325 MG PO TABS Oral Take 1 tablet by mouth every 8 (eight) hours as  needed.     Marland Kitchen LORAZEPAM 0.5 MG PO TABS  Take 1 tablet by mouth twice daily as needed for agitation, anxiety, or insomnia. 40 tablet 0  . LOSARTAN POTASSIUM 50 MG PO TABS Oral Take 50 mg by mouth daily.      . ICAPS MV PO Oral Take by mouth.      Marland Kitchen NITROFURANTOIN MACROCRYSTAL 50 MG PO CAPS Oral Take 50 mg by mouth at bedtime.      Marland Kitchen NITROGLYCERIN 0.4 MG/HR TD PT24 Transdermal Place 1 patch onto the skin every morning. Remove at bedtime     . ENSURE ENLIVE PO Oral Take by mouth 3 (three) times daily as needed.      Marland Kitchen POLYETHYLENE GLYCOL 3350 PO PACK Oral Take 17 g by mouth daily.      Marland Kitchen PROMETHAZINE HCL 12.5 MG PO TABS  1/2 by mouth twice daily.     Marland Kitchen SIMVASTATIN 20 MG PO TABS Oral Take 20 mg by mouth daily.      Marland Kitchen TAMSULOSIN HCL 0.4 MG PO CAPS Oral Take by mouth.      . ZOLPIDEM TARTRATE 5 MG PO TABS Oral Take 5 mg by mouth at bedtime as needed.     Marland Kitchen DOXYCYCLINE HYCLATE 100 MG PO CAPS Oral Take 1 capsule (100 mg total) by mouth 2 (two) times daily. 14 capsule 0    BP 124/61  Pulse 67  Temp(Src) 99.1 F (37.3 C) (Oral)  Resp 18  Ht 5\' 9"  (1.753 m)  Wt 133 lb (60.328 kg)  BMI 19.64 kg/m2  SpO2 93%  Physical Exam  Constitutional: She is oriented to person, place, and time. She appears well-developed and well-nourished.  Non-toxic appearance. She does not have a sickly appearance.  HENT:  Head: Normocephalic and atraumatic.  Eyes: Conjunctivae, EOM and lids are normal. Pupils are equal, round, and reactive to light. No scleral icterus.  Neck: Trachea normal and normal range of motion. Neck supple.  Cardiovascular: Normal rate, regular rhythm and normal heart sounds.     Pulmonary/Chest: Effort normal and breath sounds normal. No respiratory distress. She has no wheezes.       Patient has mild decreased breath sounds bilaterally.  Scattered crackles.  Abdominal: Soft. Normal appearance. There is no tenderness. There is no rebound, no guarding and no CVA tenderness.  Musculoskeletal: Normal range of motion.  Neurological: She is alert and oriented to person, place, and time. She has normal strength.  Skin: Skin is warm, dry and intact. No rash noted.  Psychiatric: She has a normal mood and affect. Her behavior is normal. Judgment and thought content normal.    ED Course  Procedures (including critical care time)  Labs Reviewed - No data to display Dg Chest 2 View  11/27/2011  *RADIOLOGY REPORT*  Clinical Data: Shortness of breath, cough, history of CHF, pulmonary fibrosis, and hypertension  CHEST - 2 VIEW  Comparison: 08/09/2011  Findings: Chronic bilateral perihilar and peripheral reticular nodular opacities and scarring.  No significant interval change in aeration pattern.  No definite superimposed pneumonia, collapse, new consolidation, or CHF.  Stable calcified granulomatous disease in the right hilum.  Left shoulder hemiarthroplasty noted. Previous L1 vertebral augmentation and cholecystectomy. Atherosclerosis evident of the aorta.  IMPRESSION: Stable chronic lung disease.  No superimposed acute process by plain radiography.  Original Report Authenticated By: Judie Petit. Ruel Favors, M.D.     1. Bronchitis       MDM  Patient with likely bronchitis given her  increased cough but no significant fevers.  Patient has some mild increase in her shortness of breath from baseline but has been able to do her normal activities all week.  She came in on the recommendation of her primary care physician for further evaluation given her cough.  Patient has been using her oxygen at night as directed and intermittently during the day as needed.  She's not been using her  albuterol which I have counseled her on increased use of her albuterol during times of illness.  I will place the patient on doxycycline for antibiotic treatment for bronchitis given her underlying pulmonary disease including COPD and pulmonary fibrosis.  This did not appear to be cardiac in origin as the patient has no chest pain and her story is much more consistent with bronchitis.  Patient's oxygen saturations have remained between 90 and 95% which would be expected given her known lung disease and oxygen requirement at home already.  Patient understands to return if her symptoms worsen and to followup with her pulmonologist and her primary care physician as well.  Reassessment of patient after breathing treatment still demonstrates no wheezing but some mild improvement in her air movement with scattered crackles      Nat Christen, MD 11/27/11 2137

## 2011-12-12 ENCOUNTER — Other Ambulatory Visit: Payer: Self-pay | Admitting: Internal Medicine

## 2011-12-12 MED ORDER — LORAZEPAM 0.5 MG PO TABS: ORAL_TABLET | ORAL | Status: DC

## 2011-12-12 NOTE — Telephone Encounter (Signed)
Lorazepam request [last refill 04.18.12 #[id number]].

## 2011-12-12 NOTE — Telephone Encounter (Signed)
Ok 40, no RF 

## 2011-12-12 NOTE — Telephone Encounter (Signed)
Done

## 2011-12-19 ENCOUNTER — Ambulatory Visit (INDEPENDENT_AMBULATORY_CARE_PROVIDER_SITE_OTHER): Payer: Medicare Other | Admitting: Internal Medicine

## 2011-12-19 VITALS — BP 122/68 | HR 71 | Temp 98.3°F | Wt 133.0 lb

## 2011-12-19 DIAGNOSIS — J449 Chronic obstructive pulmonary disease, unspecified: Secondary | ICD-10-CM

## 2011-12-19 DIAGNOSIS — I509 Heart failure, unspecified: Secondary | ICD-10-CM

## 2011-12-19 MED ORDER — BUDESONIDE-FORMOTEROL FUMARATE 160-4.5 MCG/ACT IN AERO: 2.0000 | INHALATION_SPRAY | Freq: Two times a day (BID) | RESPIRATORY_TRACT | Status: DC

## 2011-12-19 MED ORDER — ALBUTEROL SULFATE HFA 108 (90 BASE) MCG/ACT IN AERS: 2.0000 | INHALATION_SPRAY | Freq: Four times a day (QID) | RESPIRATORY_TRACT | Status: AC | PRN

## 2011-12-19 NOTE — Progress Notes (Signed)
  Subjective:    Patient ID: [name], female    DOB: 07/30/1920, 78 y.o.   MRN: [id number]  HPI Reason for the visit -->  ER followup, "not improving" Here with her daughter, went to the ER 11-27-2011 with cough, a chest x-ray was at baseline, she was diagnosed with bronchitis, took doxycycline and encouraged to use albuterol. She has not been using albuterol. Today she reports that she feels "terrible" is hard for her to explain that in more detail but basically she has no energy, shortness of breath is about the same, cough has decreased.  Past Medical History: Dementia Osteoarthritis with chronic neck pain Hyperlipidemia GERD Macular degeneration-- L blindness,  R: peripheral vision only Gait disorder CV: ---Ischemic cardiomyopathy.  This was presumed based on EKG and echocardiogram.  Her EKG shows evidence for an old anterior MI. Most recent echo in 1/11 with EF 30%, apical aneurysm, mild LVH, grade I diastolic dysfunction, mild MR, no AS.  ---Possible TIAs PULMONARY ---Mild aspiration per swallow test 1-11 ---Pulmonary fibrosis--COPD  Patient was on home O2 at one point at night   --- question of whether her pulmonary fibrosis was triggered as an adverse reaction to Macrodantin. Chronic UTIs, Sepsis- UTI w/ MS changes 10-09 Status post left hip fracture with pinning in September 2009. History of falls. .   Past Surgical History: Cholecystectomy Hysterectomy Oophorectomy Spinal fusion,x 3 ,  lower spine hip fracture 07-2008  Social History: the patient is a resident of  [facility].  widow Daughter Dione Booze lives in [place], smoked for 30 years but quit over 30 years  ago.  Nondrinker.   Has no history of drug abuse.       Review of Systems No fever or chills Mild nausea at baseline, no vomiting or diarrhea. Admits  to lower extremity myalgias and headaches, on further questioning, she is not sure if the "headache" is related to  the neck pain or something different.     Objective:   Physical Exam  Constitutional: She appears well-developed.  HENT:  Head: Normocephalic and atraumatic.  Cardiovascular: Normal rate, regular rhythm and normal heart sounds.   No murmur heard. Pulmonary/Chest:       decreased sounds otherwise clear. Is not using her oxygen  Musculoskeletal: She exhibits no edema.  Neurological: She is alert.  Psychiatric: Her behavior is normal.      Assessment & Plan:  Additional time spent in the following issues: 1. Today we discussing hospice b/c patient declines further blood work and thus need to focus on conservative care ; daughter reports that she has good days and bad days, today she does not feel well, but they will take that into consideration.  2. Witting a letter , insurance not covering phenergan or estrace  3. She is complaining of headaches and muscle aches, I like to check a sedimentation rate but she declines. Today , I spent more than 30 min with the patient, >50% of the time counseling, see above

## 2011-12-19 NOTE — Assessment & Plan Note (Signed)
See "COPD"

## 2011-12-19 NOTE — Assessment & Plan Note (Addendum)
Present w/ SOB, she has ischemic cardiomyopathy and pulm fibrosis, does not seem to be vol overload. Has not been taking albuterol.  Sx may be simply from end stage fibrosis but she needs labs to r/o other things like anemia; she strongly refused labs  , told pt and daughter that i can "miss things" if labs are not checked but she won't do it , so I will do the best I can. Plan: O2 24/7 Add symbicort, Rf albuterol

## 2011-12-19 NOTE — Patient Instructions (Signed)
Use the oxygen 24/7  symbicort 2 puffs twice a day every day Ventolin as needed for cough -wheezing Came back in 2 months

## 2011-12-20 ENCOUNTER — Encounter: Payer: Self-pay | Admitting: Internal Medicine

## 2012-02-08 ENCOUNTER — Telehealth: Payer: Self-pay | Admitting: Internal Medicine

## 2012-02-08 MED ORDER — HYDROCODONE-ACETAMINOPHEN 5-325 MG PO TABS: 1.0000 | ORAL_TABLET | Freq: Four times a day (QID) | ORAL | Status: DC | PRN

## 2012-02-08 NOTE — Telephone Encounter (Signed)
I agree, she needs Norco regularly. Our records indicates one every 8 hours but if she has been taking it 4 times a day that is okay (i just changed sig. to qid) .  Please notify the facility, send a prescription if needed.

## 2012-02-08 NOTE — Telephone Encounter (Signed)
Pts daughter states the assisted living facility has discontinued the pts pain medication. Daughter states that this is incorrect and there should not have been any changes to the medication. She states the pt takes her hydrocodone at 6a, 12p, 6p, and 10p. There is nothing in the chart indicating that the med should be discontinued. Pts daughter is requesting that we contact the facility and refill her medication.  [facility]- ph# [phone]  [facility]- ph# [phone]

## 2012-02-08 NOTE — Telephone Encounter (Signed)
Please advise 

## 2012-02-08 NOTE — Telephone Encounter (Signed)
Refill done & faxed over order to [facility].

## 2012-02-12 ENCOUNTER — Ambulatory Visit (INDEPENDENT_AMBULATORY_CARE_PROVIDER_SITE_OTHER): Payer: Medicare Other | Admitting: Internal Medicine

## 2012-02-12 VITALS — BP 116/72 | HR 72 | Temp 98.4°F | Wt 127.0 lb

## 2012-02-12 DIAGNOSIS — N390 Urinary tract infection, site not specified: Secondary | ICD-10-CM

## 2012-02-12 DIAGNOSIS — J449 Chronic obstructive pulmonary disease, unspecified: Secondary | ICD-10-CM

## 2012-02-12 DIAGNOSIS — J4489 Other specified chronic obstructive pulmonary disease: Secondary | ICD-10-CM

## 2012-02-12 DIAGNOSIS — R300 Dysuria: Secondary | ICD-10-CM

## 2012-02-12 LAB — POCT URINALYSIS DIPSTICK
Ketones, UA: NEGATIVE
Protein, UA: NEGATIVE
pH, UA: 6.5

## 2012-02-12 MED ORDER — CIPROFLOXACIN HCL 250 MG PO TABS: 250.0000 mg | ORAL_TABLET | Freq: Two times a day (BID) | ORAL | Status: AC

## 2012-02-12 NOTE — Assessment & Plan Note (Signed)
See history of present illness, respiratory status actually improving, she continue to  dry crackles at bases which is not a new finding.

## 2012-02-12 NOTE — Patient Instructions (Signed)
Drink plenty of fluids Stop Macrobid Ciprofloxacin for 5 days you likely will need another antibiotic for everyday use. We will wait until we see you culture.

## 2012-02-12 NOTE — Progress Notes (Signed)
  Subjective:    Patient ID: [name], female    DOB: 03-08-1920, 78 y.o.   MRN: [id number]  HPI Acute visit, here with her daughter. One-week history of severe dysuria, good compliance with Macrobid She was also recently seen with cough, "eventually the cough went away, I feel better". The daughter reports that she looks stronger although she has lost some weight.  Past Medical History:  Dementia  Osteoarthritis with chronic neck pain  Hyperlipidemia  GERD  Macular degeneration-- L blindness, R: peripheral vision only  Gait disorder  CV:  ---Ischemic cardiomyopathy. This was presumed based on EKG and echocardiogram. Her EKG shows evidence for an old anterior MI. Most recent echo in 1/11 with EF 30%, apical aneurysm, mild LVH, grade I diastolic dysfunction, mild MR, no AS.  ---Possible TIAs  PULMONARY  ---Mild aspiration per swallow test 1-11  ---Pulmonary fibrosis--COPD Patient was on home O2 at one point at night  --- question of whether her pulmonary fibrosis was triggered as an adverse reaction to Macrodantin.  Chronic UTIs, Sepsis- UTI w/ MS changes 10-09  Status post left hip fracture with pinning in September 2009.  History of falls.  .  Past Surgical History:  Cholecystectomy  Hysterectomy  Oophorectomy  Spinal fusion,x 3 , lower spine  hip fracture 07-2008  Social History:  the patient is a resident of [facility] facility.  widow  Daughter [name] lives in [place], smoked for 30 years but quit over 30 years ago. Nondrinker.  Has no history of drug abuse.      Review of Systems No fever chills, no nausea or vomiting. Mild suprapubic pain. Bowel movements are baseline, she has some problems with constipation. Still short of breath, at baseline.     Objective:   Physical Exam  Alert, no apparent distress. Lungs, dry crackles at bases, not a new finding Cardiovascular, regular rate and rhythm. Abdomen, no CVA tenderness. Soft,  nontender, not distended. Extremities without edema.      Assessment & Plan:

## 2012-02-12 NOTE — Assessment & Plan Note (Signed)
Symptoms and udip  consistent with a UTI. Urine culture sent. She has been on Macrobid for a while. Plan: Discontinue Macrobid Urine culture Cipro Daily abx antibiotics depending on the urine culture

## 2012-02-13 ENCOUNTER — Encounter: Payer: Self-pay | Admitting: Internal Medicine

## 2012-02-18 ENCOUNTER — Telehealth: Payer: Self-pay | Admitting: Internal Medicine

## 2012-02-18 MED ORDER — LORAZEPAM 0.5 MG PO TABS: ORAL_TABLET | ORAL | Status: DC

## 2012-02-18 NOTE — Telephone Encounter (Signed)
Refill: Lorazepam 0.5mg  tablet #40. Take 1 tablet by mouth twice daily as needed for agitation/anxiety/insomnia (control). Last fill 12-12-11.

## 2012-02-18 NOTE — Telephone Encounter (Signed)
Refill done.  

## 2012-02-18 NOTE — Telephone Encounter (Signed)
Ok to refill 

## 2012-02-18 NOTE — Telephone Encounter (Signed)
60, 3 Rf

## 2012-02-20 ENCOUNTER — Other Ambulatory Visit: Payer: Self-pay | Admitting: *Deleted

## 2012-02-20 MED ORDER — NITROFURANTOIN MONOHYD MACRO 100 MG PO CAPS: 100.0000 mg | ORAL_CAPSULE | Freq: Two times a day (BID) | ORAL | Status: AC

## 2012-03-04 DIAGNOSIS — Z0279 Encounter for issue of other medical certificate: Secondary | ICD-10-CM

## 2012-03-07 ENCOUNTER — Telehealth: Payer: Self-pay | Admitting: *Deleted

## 2012-03-07 NOTE — Telephone Encounter (Signed)
Misty Stanley from [facility] called & wanted to know if the pt is suppose to be taking Simvastatin. The med list we faxed over yesterday has the med on the list but they have not been giving the pt the med. Please advise.

## 2012-03-07 NOTE — Telephone Encounter (Signed)
Discussed with crystal at [facility] & they are removing it from their med list as well.

## 2012-03-07 NOTE — Telephone Encounter (Signed)
Agree, simvastatin was discontinued 04/2011 due to polypharmacy. I just remove it from our medication list.

## 2012-04-10 ENCOUNTER — Telehealth: Payer: Self-pay

## 2012-04-10 NOTE — Telephone Encounter (Signed)
Discussed with mary @ [facility].

## 2012-04-10 NOTE — Telephone Encounter (Signed)
Send a copy of our list to the  assisted-living facility. He does need aspirin. Robitussin and Tylenol as needed.

## 2012-04-10 NOTE — Telephone Encounter (Signed)
Patient recently had paperwork completed and 3 meds on our list was not on the list sent over by assisted living facility: Please advise is patient is to take tylenol, ASA, and Robitussin

## 2012-04-16 ENCOUNTER — Telehealth: Payer: Self-pay | Admitting: *Deleted

## 2012-04-16 NOTE — Telephone Encounter (Signed)
[facility] Triage Call Report Triage Record Num: [id number] Operator: Sula Rumple Patient Name: Buelah Gary Call Date & Time: 04/14/2012 3:37:38PM Patient Phone: [phone] PCP: Nolon Rod. Paz Patient Gender: Female PCP Fax : Patient DOB: 09/27/1920 Practice Name: [facility] Reason for Call: Caller: Mary/LPN; PCP: Willow Ora; CB#: [phone]; Call regarding UA results; Mary/LPN calling on 08/65/78 from [facility] with U/A results: Culture > 100/,000 lactobacillus colonies/ some RBC's/ pt symptoms were confusion/urine has an odor/may use Septra DS 1 BID po x 10 days or until culture is reported Protocol(s) Used: PCP Calls, No Triage (Adult) Recommended Outcome per Protocol: Call Provider within 24 Hours Reason for Outcome: Lab calling with test results Care Advice:

## 2012-04-17 NOTE — Telephone Encounter (Signed)
[facility] with [facility] who states that Pt is taking the septra and is doing much better now.

## 2012-04-17 NOTE — Telephone Encounter (Signed)
Discuss with patient assistance living. Spoke with Corrie Dandy and informed her of Dr Drue Novel suggestion.

## 2012-04-17 NOTE — Telephone Encounter (Signed)
Take Septra for one week, then discontinue it

## 2012-04-17 NOTE — Telephone Encounter (Signed)
Please clarify with [facility]: Is she on septra already? Is she better? still confused?

## 2012-04-22 ENCOUNTER — Telehealth: Payer: Self-pay | Admitting: Internal Medicine

## 2012-04-22 MED ORDER — NITROGLYCERIN 0.4 MG/HR TD PT24: 1.0000 | MEDICATED_PATCH | TRANSDERMAL | Status: DC

## 2012-04-22 NOTE — Telephone Encounter (Signed)
Refill: Nitroglycerin 0.4mg /hr qty 30. Apply 1 patch every morning, remove patch at bedtime. Last fill 03-24-12

## 2012-04-22 NOTE — Telephone Encounter (Signed)
Refill done.  

## 2012-04-23 ENCOUNTER — Other Ambulatory Visit: Payer: Self-pay | Admitting: Internal Medicine

## 2012-04-23 MED ORDER — ALENDRONATE SODIUM 70 MG PO TABS: 70.0000 mg | ORAL_TABLET | ORAL | Status: DC

## 2012-04-23 NOTE — Telephone Encounter (Signed)
refill alendronate sodium 70mg   Qty 4 Take one tablet by mouth once a week on Wednesday last fill 5.3.13 Last ov 3.26.13

## 2012-04-23 NOTE — Telephone Encounter (Signed)
Refill done.  

## 2012-05-05 ENCOUNTER — Other Ambulatory Visit: Payer: Self-pay | Admitting: Internal Medicine

## 2012-05-05 MED ORDER — BUDESONIDE-FORMOTEROL FUMARATE 160-4.5 MCG/ACT IN AERO: 2.0000 | INHALATION_SPRAY | Freq: Two times a day (BID) | RESPIRATORY_TRACT | Status: DC

## 2012-05-05 NOTE — Telephone Encounter (Signed)
refill symbicort 160/4.5 MCG INH #10.20 GM Inhale 2 puffs into lungs 2 times a day  Last fil 5.14.13 Last ov 3.26.13

## 2012-05-05 NOTE — Telephone Encounter (Signed)
Refill done.  

## 2012-05-08 ENCOUNTER — Other Ambulatory Visit: Payer: Self-pay | Admitting: *Deleted

## 2012-05-08 MED ORDER — ZOLPIDEM TARTRATE 5 MG PO TABS: 5.0000 mg | ORAL_TABLET | Freq: Every evening | ORAL | Status: DC | PRN

## 2012-05-08 NOTE — Telephone Encounter (Signed)
Refill request for Ambien 5mg  #30 with zero refills. Ok to refill?

## 2012-05-08 NOTE — Telephone Encounter (Signed)
Refill done.  

## 2012-05-08 NOTE — Telephone Encounter (Signed)
30, 3 RF 

## 2012-05-26 ENCOUNTER — Other Ambulatory Visit: Payer: Self-pay | Admitting: Internal Medicine

## 2012-05-26 MED ORDER — NITROGLYCERIN 0.4 MG/HR TD PT24: 1.0000 | MEDICATED_PATCH | TRANSDERMAL | Status: AC

## 2012-05-26 MED ORDER — CARVEDILOL 6.25 MG PO TABS: 6.2500 mg | ORAL_TABLET | Freq: Two times a day (BID) | ORAL | Status: DC

## 2012-05-26 NOTE — Telephone Encounter (Signed)
Also requested carvedilol 6.25mg  tablet  #60  & Nitroglycerin 0.4 mg/hr p #30

## 2012-05-26 NOTE — Telephone Encounter (Signed)
Ok to refill ambien

## 2012-05-26 NOTE — Telephone Encounter (Signed)
No, see prescriptions, we just RF Ambien last month

## 2012-05-26 NOTE — Telephone Encounter (Signed)
refill zolpidem tartrate 5mg  tab #30, take 1 tablet by mouth at bedtime  Last fill - not listed Last ov 3.26.13 We shoe last wrt. 6.20.13 #30-wt/3-refills

## 2012-06-23 ENCOUNTER — Telehealth: Payer: Self-pay | Admitting: *Deleted

## 2012-06-23 MED ORDER — CIPROFLOXACIN HCL 250 MG PO TABS: 250.0000 mg | ORAL_TABLET | Freq: Two times a day (BID) | ORAL | Status: AC

## 2012-06-23 NOTE — Telephone Encounter (Signed)
Mary @ [facility] called stating that the pt is complaining of lower abdominal pain, frequent urination, & burning. Mary asked if they need to collect a specimen or if you wanted to prescribe the pt an antibiotic. Please advise.

## 2012-06-23 NOTE — Telephone Encounter (Signed)
Noted  

## 2012-06-23 NOTE — Telephone Encounter (Signed)
Check a UA and a urine culture, fax results ASAP. Start empiric Cipro 250 mg one daily twice a day for 5 days #10 At some point she was taking daily Macrobid but apparently she's not. Please clarify that with [facility]

## 2012-06-23 NOTE — Telephone Encounter (Signed)
Discussed with [name] @ [facility] & sent in cipro 250mg . Corrie Dandy also stated that the pt has not been taking the macrobid.

## 2012-07-03 ENCOUNTER — Other Ambulatory Visit: Payer: Self-pay | Admitting: Internal Medicine

## 2012-07-03 MED ORDER — HYDROCODONE-ACETAMINOPHEN 5-325 MG PO TABS: 1.0000 | ORAL_TABLET | Freq: Four times a day (QID) | ORAL | Status: DC | PRN

## 2012-07-03 NOTE — Telephone Encounter (Signed)
I called patient, she lives in an assisted living facility. I spoke with the nurse Corrie Dandy) and informed them Dr.Hopper will approve for her to have #60 pills

## 2012-07-03 NOTE — Telephone Encounter (Signed)
Hydrocodone-Acetaminophen (Tab) NORCO/VICODIN 5-325 MG Take 1 tablet by mouth every 6 (six) hours as needed. #120  last wrt 3.22.13 #120 wt/1-refill Last ov 3.26.13 Acute

## 2012-07-11 ENCOUNTER — Telehealth: Payer: Self-pay | Admitting: Internal Medicine

## 2012-07-11 MED ORDER — NITROFURANTOIN MACROCRYSTAL 50 MG PO CAPS: ORAL_CAPSULE | ORAL | Status: DC

## 2012-07-11 NOTE — Telephone Encounter (Signed)
Discussed with [facility], sent in rx, & scheduled an OV.

## 2012-07-11 NOTE — Telephone Encounter (Signed)
Urine culture collected 06/23/2012 show Escherichia coli, resistant to Cipro Please call [facility], needs Macrobid 50 mg one tab twice a day for one week, then stay on Macrobid one tablet daily. #30, 3 RF. Additionally, they are sending a number of papers for me to sign. She needs office visit. Please schedule.

## 2012-07-17 ENCOUNTER — Telehealth: Payer: Self-pay | Admitting: Internal Medicine

## 2012-07-17 MED ORDER — ALENDRONATE SODIUM 70 MG PO TABS: 70.0000 mg | ORAL_TABLET | ORAL | Status: DC

## 2012-07-17 NOTE — Telephone Encounter (Signed)
Refill: Alendronate sodium 70mg . Take 1 tablet by mouth every 7 days on Wed with a full glass of water on an empty stomach for osteoporosis.

## 2012-07-17 NOTE — Telephone Encounter (Signed)
Refill done.  

## 2012-07-31 ENCOUNTER — Telehealth: Payer: Self-pay | Admitting: *Deleted

## 2012-07-31 NOTE — Telephone Encounter (Signed)
Orders received from [facility]. Orders signed and faxed back to [phone] with updated med list.

## 2012-08-01 ENCOUNTER — Ambulatory Visit: Payer: Medicare Other | Admitting: Internal Medicine

## 2012-08-11 ENCOUNTER — Telehealth: Payer: Self-pay | Admitting: Internal Medicine

## 2012-08-11 MED ORDER — ALENDRONATE SODIUM 70 MG PO TABS: 70.0000 mg | ORAL_TABLET | ORAL | Status: DC

## 2012-08-11 NOTE — Telephone Encounter (Signed)
refill alendronate sodium 70mg  #4-tab -- last fill 7.25.13--Take 1 tablet (70 mg total) by mouth every 7 (seven) days. Take with a full glass of water on an empty stomach. Last ov 3.26.13 acute

## 2012-08-11 NOTE — Telephone Encounter (Signed)
Refill done.  

## 2012-08-26 DIAGNOSIS — Z0279 Encounter for issue of other medical certificate: Secondary | ICD-10-CM

## 2012-09-04 ENCOUNTER — Telehealth: Payer: Self-pay | Admitting: Internal Medicine

## 2012-09-04 NOTE — Telephone Encounter (Signed)
Daughter wanted pt worked in for bladder infect., dr.paz approved got back on phone wt/daughter she was on other phone with [facility] they stated pt was stable and on macrobid already & they would run a UA there. Daughter was appreciative but stated she call back if she needed [place] to see her mom will hold off on appt for now

## 2012-09-08 ENCOUNTER — Other Ambulatory Visit: Payer: Self-pay | Admitting: Internal Medicine

## 2012-09-08 MED ORDER — LORAZEPAM 0.5 MG PO TABS: ORAL_TABLET | ORAL | Status: DC

## 2012-09-08 NOTE — Telephone Encounter (Signed)
refill lorazepam 0.5mg  tablet #60-Take one tablet by mouth twice daily for agitation, anxiety or Insomnia--last wrt 4.1.13 #60

## 2012-09-08 NOTE — Telephone Encounter (Signed)
Ok to refill 

## 2012-09-08 NOTE — Telephone Encounter (Signed)
Ok 60, 3 RF 

## 2012-09-11 ENCOUNTER — Telehealth: Payer: Self-pay | Admitting: Internal Medicine

## 2012-09-11 MED ORDER — CEFUROXIME AXETIL 500 MG PO TABS: 500.0000 mg | ORAL_TABLET | Freq: Two times a day (BID) | ORAL | Status: DC

## 2012-09-11 NOTE — Telephone Encounter (Signed)
Call [facility]: Urine culture from 09-04-2012 showed Escherichia coli, resistant to Cipro, sulfa, Macrobid. Plan: Hold Macrobid Start cefuroxime, see prescription UA and urine culture in 3 weeks

## 2012-09-11 NOTE — Telephone Encounter (Signed)
Discussed with [facility]. Sent in rx.

## 2012-09-16 ENCOUNTER — Encounter: Payer: Self-pay | Admitting: Internal Medicine

## 2012-09-23 ENCOUNTER — Other Ambulatory Visit: Payer: Self-pay | Admitting: *Deleted

## 2012-09-23 MED ORDER — ZOLPIDEM TARTRATE 5 MG PO TABS: 5.0000 mg | ORAL_TABLET | Freq: Every evening | ORAL | Status: DC | PRN

## 2012-09-23 NOTE — Telephone Encounter (Signed)
Refill done per Dr. Drue Novel

## 2012-10-10 ENCOUNTER — Telehealth: Payer: Self-pay | Admitting: Internal Medicine

## 2012-10-10 MED ORDER — HYDROCODONE-ACETAMINOPHEN 5-325 MG PO TABS: 1.0000 | ORAL_TABLET | Freq: Four times a day (QID) | ORAL | Status: DC | PRN

## 2012-10-10 NOTE — Telephone Encounter (Signed)
Okay #60, no refills. Advise patient we won't refill 120

## 2012-10-10 NOTE — Telephone Encounter (Signed)
Refill done.  

## 2012-10-10 NOTE — Telephone Encounter (Signed)
Ok to refill 

## 2012-10-10 NOTE — Telephone Encounter (Signed)
Refill: Hydrocodone/apap 5-325. Take 1 tablet by mouth every 6 hours as needed for pain. Qty 120.

## 2012-10-23 ENCOUNTER — Other Ambulatory Visit: Payer: Self-pay | Admitting: Internal Medicine

## 2012-10-23 MED ORDER — CARVEDILOL 6.25 MG PO TABS: 6.2500 mg | ORAL_TABLET | Freq: Two times a day (BID) | ORAL | Status: DC

## 2012-10-23 NOTE — Telephone Encounter (Signed)
Refill done.  

## 2012-10-23 NOTE — Telephone Encounter (Signed)
Refill Carvedilol 6.25MG  Tablet #60 last fill 11.5.13, Take 1 tablet by mouth twice a day with meal for CHE Last ov 3.26.13--last labs 3.26.13

## 2012-10-31 ENCOUNTER — Telehealth: Payer: Self-pay | Admitting: Internal Medicine

## 2012-10-31 MED ORDER — ZOLPIDEM TARTRATE 5 MG PO TABS: 5.0000 mg | ORAL_TABLET | Freq: Every evening | ORAL | Status: DC | PRN

## 2012-10-31 NOTE — Telephone Encounter (Signed)
Ok to refill x 1  

## 2012-10-31 NOTE — Telephone Encounter (Signed)
Refill: Zolpidem tartrate 5 mg tablet. Take 1 tablet by mouth at bedtime for insomnia. Qty 31.

## 2012-10-31 NOTE — Telephone Encounter (Signed)
[facility]  [place] and would like Rx faxed to facility so they can send in to pharmacy, last Ov 09-23-12, last filled 02-12-12 #30 4

## 2012-10-31 NOTE — Telephone Encounter (Signed)
Faxed.   KP 

## 2012-11-05 ENCOUNTER — Other Ambulatory Visit: Payer: Self-pay | Admitting: Internal Medicine

## 2012-11-05 MED ORDER — ALENDRONATE SODIUM 70 MG PO TABS: 70.0000 mg | ORAL_TABLET | ORAL | Status: AC

## 2012-11-05 NOTE — Telephone Encounter (Signed)
Refill done. Pt needs an OV for future refills.  

## 2012-11-05 NOTE — Telephone Encounter (Signed)
refill alendronate sodium 70mg   #4 Tab, Take 1 tablet by mouth every 7 days on wed with a full galss of water on an empty stomach for osteoprosis last fill 11.20.13

## 2012-12-04 ENCOUNTER — Other Ambulatory Visit: Payer: Self-pay | Admitting: Internal Medicine

## 2012-12-04 MED ORDER — ZOLPIDEM TARTRATE 5 MG PO TABS: 5.0000 mg | ORAL_TABLET | Freq: Every evening | ORAL | Status: DC | PRN

## 2012-12-04 NOTE — Telephone Encounter (Signed)
Refill done.  

## 2012-12-04 NOTE — Telephone Encounter (Signed)
Ok to refill? Last OV 3.26.13 Last filled 12.13.13

## 2012-12-04 NOTE — Telephone Encounter (Signed)
Ok 30 and 2 RF 

## 2012-12-04 NOTE — Telephone Encounter (Signed)
refill  Zolpidem Tartrate (Tab) 5 MG Take 1 tablet (5 mg total) by mouth at bedtime as needed.  #30 no last fill date--last wrt 12.13.13

## 2012-12-30 ENCOUNTER — Telehealth: Payer: Self-pay | Admitting: Internal Medicine

## 2012-12-30 MED ORDER — HYDROCODONE-ACETAMINOPHEN 5-325 MG PO TABS: 1.0000 | ORAL_TABLET | Freq: Four times a day (QID) | ORAL | Status: DC | PRN

## 2012-12-30 NOTE — Telephone Encounter (Signed)
refill  Hydrocodone-Acetaminophen (Tab)  5-325 MG Take 1 tablet by mouth every 6 (six) hours as needed. #60 last fill note listed, last wrt 11.22.13

## 2012-12-30 NOTE — Telephone Encounter (Signed)
Ok to refill? Last OV 3.26.13 Last filled 11.22.13

## 2012-12-30 NOTE — Telephone Encounter (Signed)
Ok 60, no RF 

## 2012-12-30 NOTE — Telephone Encounter (Signed)
Refill done.  

## 2013-01-22 ENCOUNTER — Telehealth: Payer: Self-pay | Admitting: Internal Medicine

## 2013-01-22 MED ORDER — CARVEDILOL 6.25 MG PO TABS: 6.2500 mg | ORAL_TABLET | Freq: Two times a day (BID) | ORAL | Status: DC

## 2013-01-22 NOTE — Telephone Encounter (Signed)
Refill done.  

## 2013-01-22 NOTE — Telephone Encounter (Signed)
refill Carvedilol (Tab) 6.25 MG Take 1 tablet (6.25 mg total) by mouth 2 (two) times daily with a meal.  #60 last fill 2.1.14

## 2013-02-05 ENCOUNTER — Telehealth: Payer: Self-pay | Admitting: Internal Medicine

## 2013-02-05 NOTE — Telephone Encounter (Signed)
REFILL CARVEDILOL 6.25 MG TABLET #60 TAKE 1 TABLET BY MOUTH TWICE A DAY WITH MEAL FOR CHF  SUBSTITUTE FOR COREG 6.25 MG TABLET

## 2013-02-06 ENCOUNTER — Encounter: Payer: Self-pay | Admitting: *Deleted

## 2013-02-06 MED ORDER — CARVEDILOL 6.25 MG PO TABS: 6.2500 mg | ORAL_TABLET | Freq: Two times a day (BID) | ORAL | Status: DC

## 2013-02-06 NOTE — Telephone Encounter (Signed)
Mailed letter to pt, no working Psychologist, sport and exercise.  Refill done.

## 2013-02-06 NOTE — Telephone Encounter (Signed)
Pt has not been seen within a year. Ok to refill?

## 2013-02-06 NOTE — Telephone Encounter (Signed)
Ok  1 month and one RF Please schedule a ROV, 30 minutes

## 2013-02-17 ENCOUNTER — Telehealth: Payer: Self-pay | Admitting: Internal Medicine

## 2013-02-17 MED ORDER — HYDROCODONE-ACETAMINOPHEN 5-325 MG PO TABS: 1.0000 | ORAL_TABLET | Freq: Four times a day (QID) | ORAL | Status: DC | PRN

## 2013-02-17 NOTE — Telephone Encounter (Signed)
Ok to refill? Last OV 3.26.13

## 2013-02-17 NOTE — Telephone Encounter (Signed)
Refill: Hydrocodone-acetaminophen 5-325 mg. Take 1 tablet by mouth every 6 hours as needed for pain. Qty 60.   Zolpidem tartrate 5 mg tablet. Take 1 tab by mouth at bedtime as needed for insomnia. Qty 30.

## 2013-02-17 NOTE — Telephone Encounter (Signed)
done

## 2013-02-18 MED ORDER — ZOLPIDEM TARTRATE 5 MG PO TABS: 5.0000 mg | ORAL_TABLET | Freq: Every evening | ORAL | Status: DC | PRN

## 2013-02-18 NOTE — Addendum Note (Signed)
Addended by: Edwena Felty T on: 02/18/2013 02:27 PM   Modules accepted: Orders

## 2013-02-23 ENCOUNTER — Encounter: Payer: Self-pay | Admitting: Lab

## 2013-02-24 ENCOUNTER — Ambulatory Visit (INDEPENDENT_AMBULATORY_CARE_PROVIDER_SITE_OTHER): Payer: Medicare Other | Admitting: Internal Medicine

## 2013-02-24 ENCOUNTER — Encounter: Payer: Self-pay | Admitting: Internal Medicine

## 2013-02-24 VITALS — BP 124/72 | HR 79 | Temp 97.7°F | Wt 133.0 lb

## 2013-02-24 DIAGNOSIS — M1990 Unspecified osteoarthritis, unspecified site: Secondary | ICD-10-CM

## 2013-02-24 DIAGNOSIS — K5900 Constipation, unspecified: Secondary | ICD-10-CM

## 2013-02-24 DIAGNOSIS — N390 Urinary tract infection, site not specified: Secondary | ICD-10-CM

## 2013-02-24 DIAGNOSIS — M810 Age-related osteoporosis without current pathological fracture: Secondary | ICD-10-CM

## 2013-02-24 DIAGNOSIS — I509 Heart failure, unspecified: Secondary | ICD-10-CM

## 2013-02-24 DIAGNOSIS — J8410 Pulmonary fibrosis, unspecified: Secondary | ICD-10-CM

## 2013-02-24 LAB — CBC WITH DIFFERENTIAL/PLATELET
Basophils Relative: 0.5 % (ref 0.0–3.0)
Eosinophils Absolute: 0.2 10*3/uL (ref 0.0–0.7)
Hemoglobin: 14.5 g/dL (ref 12.0–15.0)
Lymphocytes Relative: 20.2 % (ref 12.0–46.0)
MCHC: 33.2 g/dL (ref 30.0–36.0)
MCV: 86.9 fl (ref 78.0–100.0)
Neutro Abs: 9 10*3/uL — ABNORMAL HIGH (ref 1.4–7.7)
RBC: 5.04 Mil/uL (ref 3.87–5.11)

## 2013-02-24 LAB — BASIC METABOLIC PANEL
CO2: 25 mEq/L (ref 19–32)
Calcium: 10.1 mg/dL (ref 8.4–10.5)
Chloride: 101 mEq/L (ref 96–112)
Sodium: 135 mEq/L (ref 135–145)

## 2013-02-24 LAB — URINALYSIS, ROUTINE W REFLEX MICROSCOPIC
Bilirubin Urine: NEGATIVE
Total Protein, Urine: NEGATIVE
Urine Glucose: NEGATIVE

## 2013-02-24 NOTE — Assessment & Plan Note (Signed)
Continue vitamin D, will have to discontinue calcium do to constipation.

## 2013-02-24 NOTE — Patient Instructions (Addendum)
Stop calcium and vitamin D. Take only vitamin D 1000 units daily MiraLax daily Use lactulose and Colace as recommended for constipation. Next visit in 6 months

## 2013-02-24 NOTE — Progress Notes (Signed)
  Subjective:    Patient ID: [name], female    DOB: 06-10-1920, 79 y.o.   MRN: [id number]  HPI ROV,  Here with her daughter, last visit ~ a year ago UTIs, on qd antibiotics, asymptomatic. DJD, on hydrocodone as needed, currently having neck pain that radiates to the head, feels almost like a headache. occ nausea, related to hydrocodone? Takes Phenergan as needed. Pulmonary fibrosis, on oxygen when necessary. Occasional wheezing, good compliance with inhalers. Constipation, a ongoing issue. See below.   Past Medical History:   Dementia   Osteoarthritis with chronic neck pain   Hyperlipidemia   GERD   Macular degeneration-- L blindness, R: peripheral vision only   Gait disorder   CV:   ---Ischemic cardiomyopathy. This was presumed based on EKG and echocardiogram. Her EKG shows evidence for an old anterior MI. Most recent echo in 1/11 with EF 30%, apical aneurysm, mild LVH, grade I diastolic dysfunction, mild MR, no AS.   ---Possible TIAs   PULMONARY   ---Mild aspiration per swallow test 1-11   ---Pulmonary fibrosis--COPD Patient was on home O2 at one point at night   --- question of whether her pulmonary fibrosis was triggered as an adverse reaction to Macrodantin.   Chronic UTIs, Sepsis- UTI w/ MS changes 10-09   Status post left hip fracture with pinning in September 2009.   History of falls.   .   Past Surgical History:   Cholecystectomy   Hysterectomy   Oophorectomy   Spinal fusion,x 3 , lower spine   hip fracture 07-2008    Social History:   the patient is a resident of [facility].   widow   Daughter Dione Booze lives in [place], smoked for 30 years but quit over 30 years ago. Nondrinker.   Has no history of drug abuse.     Review of Systems Denies  chest pain, no lower  extremity edema. No nausea, vomiting, diarrhea or blood in the stools.     Objective:   Physical Exam BP 124/72  Pulse 79  Temp(Src) 97.7 F (36.5 C)  (Oral)  Wt 133 lb (60.328 kg)  BMI 19.63 kg/m2  SpO2 91%  General --elderly lady, frail but  in no distress at rest.   Neck --no JVD at 45  Lungs --Dry crackles throughout both fields Heart-- normal rate, regular rhythm, no murmur, and no gallop.   Abdomen--soft, non-tender, no distention, no masses, no HSM, no guarding, and no rigidity.   Extremities-- no pretibial edema bilaterally Psych--   not anxious appearing and not depressed appearing.       Assessment & Plan:

## 2013-02-24 NOTE — Assessment & Plan Note (Addendum)
Last visit with cardiology ~ 2011 , she seems to be very stable at this point. Given her age, comorbidities and the fact that she is doing well we agreed not to pursue aggressive re-evaluation of her CHF. Will refill medications and check her labs.

## 2013-02-24 NOTE — Assessment & Plan Note (Signed)
Ongoing problem,MAR reviewed, she has when necessary medications but essentially not using them. Plan: Discontinue calcium, continue vitamin D. Use lactulose and Colace as needed Continue MiraLax daily All instructions written for the patient and [facility]

## 2013-02-24 NOTE — Assessment & Plan Note (Addendum)
Will see Dr. Ethelene Hal tomorrow. She has some neck pain and headaches, likely the headache is cervicogenic based on the way it is described. Advise daughter to call me if tDr Ramos doesn't  thinks is related to DJD.

## 2013-02-24 NOTE — Assessment & Plan Note (Addendum)
She seems to be doing well, last year she had dry crackles half way from the bases, not dry crackles throughout the lungs. Plan: Continue with same medications, check a chest x-ray Addendum-- declined CXR

## 2013-02-25 ENCOUNTER — Telehealth: Payer: Self-pay | Admitting: Internal Medicine

## 2013-02-25 ENCOUNTER — Ambulatory Visit: Payer: Medicare Other | Admitting: Internal Medicine

## 2013-02-25 MED ORDER — BUDESONIDE-FORMOTEROL FUMARATE 160-4.5 MCG/ACT IN AERO: 2.0000 | INHALATION_SPRAY | Freq: Two times a day (BID) | RESPIRATORY_TRACT | Status: AC

## 2013-02-25 NOTE — Telephone Encounter (Signed)
Refill: symbicort 160/4.5 mcg inh. Inhale 2 puffs into lungs 2 times a day for copd. Qty 10.2 gm. Last fill 01-16-13

## 2013-02-25 NOTE — Telephone Encounter (Signed)
Refill done.  

## 2013-02-25 NOTE — Telephone Encounter (Signed)
Refill: Hydrocodone-acetaminophen 5-325 mg. Take 1 tablet by mouth every 6 hours as needed for pain (control). Qty 60.

## 2013-02-26 LAB — URINE CULTURE: Colony Count: >=100000

## 2013-02-26 MED ORDER — HYDROCODONE-ACETAMINOPHEN 5-325 MG PO TABS: 1.0000 | ORAL_TABLET | Freq: Four times a day (QID) | ORAL | Status: DC | PRN

## 2013-02-26 NOTE — Telephone Encounter (Signed)
Spoke to pharmacy, they did not receive the refill that was sent in on 4.1.14. Re-faxed rx to pharmacy.

## 2013-02-27 MED ORDER — SULFAMETHOXAZOLE-TRIMETHOPRIM 800-160 MG PO TABS: ORAL_TABLET | ORAL | Status: DC

## 2013-02-27 NOTE — Addendum Note (Signed)
Addended by: Edwena Felty T on: 02/27/2013 04:59 PM   Modules accepted: Orders

## 2013-03-19 ENCOUNTER — Encounter: Payer: Self-pay | Admitting: Internal Medicine

## 2013-03-20 ENCOUNTER — Telehealth: Payer: Self-pay | Admitting: *Deleted

## 2013-03-20 NOTE — Telephone Encounter (Signed)
Last OV 02-24-13, no refill history. Please advise

## 2013-03-23 MED ORDER — LOSARTAN POTASSIUM 50 MG PO TABS: 50.0000 mg | ORAL_TABLET | Freq: Every day | ORAL | Status: AC

## 2013-03-23 NOTE — Telephone Encounter (Signed)
Ok RF x 6 months  

## 2013-03-23 NOTE — Telephone Encounter (Signed)
Refill done.  

## 2013-03-24 ENCOUNTER — Telehealth: Payer: Self-pay | Admitting: General Practice

## 2013-03-24 MED ORDER — LORAZEPAM 0.5 MG PO TABS: ORAL_TABLET | ORAL | Status: DC

## 2013-03-24 NOTE — Telephone Encounter (Signed)
Pt last seen on 02/24/13, med last filled on 09/08/12 #60 with 3 refills. Ok to fill?

## 2013-03-24 NOTE — Telephone Encounter (Signed)
done

## 2013-03-24 NOTE — Telephone Encounter (Signed)
Med faxed on 03/24/13.

## 2013-04-22 ENCOUNTER — Telehealth: Payer: Self-pay | Admitting: General Practice

## 2013-04-22 MED ORDER — ZOLPIDEM TARTRATE 5 MG PO TABS: 5.0000 mg | ORAL_TABLET | Freq: Every evening | ORAL | Status: DC | PRN

## 2013-04-22 NOTE — Telephone Encounter (Signed)
done

## 2013-04-22 NOTE — Telephone Encounter (Signed)
Zolpidem Refill Last OV 02-24-13 Med filled 02-18-13 #30 with 1 refill

## 2013-05-07 ENCOUNTER — Telehealth: Payer: Self-pay | Admitting: *Deleted

## 2013-05-07 NOTE — Telephone Encounter (Signed)
Last OV 02-24-13, Last refilled 02-26-13 #60 1

## 2013-05-08 MED ORDER — HYDROCODONE-ACETAMINOPHEN 5-325 MG PO TABS: 1.0000 | ORAL_TABLET | Freq: Four times a day (QID) | ORAL | Status: DC | PRN

## 2013-05-08 NOTE — Telephone Encounter (Signed)
Faxed rx

## 2013-05-08 NOTE — Telephone Encounter (Signed)
done

## 2013-07-17 ENCOUNTER — Ambulatory Visit (INDEPENDENT_AMBULATORY_CARE_PROVIDER_SITE_OTHER): Payer: Medicare Other | Admitting: Internal Medicine

## 2013-07-17 ENCOUNTER — Encounter: Payer: Self-pay | Admitting: Internal Medicine

## 2013-07-17 VITALS — BP 120/70 | HR 66 | Temp 98.8°F | Wt 140.0 lb

## 2013-07-17 DIAGNOSIS — D72829 Elevated white blood cell count, unspecified: Secondary | ICD-10-CM | POA: Insufficient documentation

## 2013-07-17 DIAGNOSIS — I509 Heart failure, unspecified: Secondary | ICD-10-CM

## 2013-07-17 DIAGNOSIS — N390 Urinary tract infection, site not specified: Secondary | ICD-10-CM

## 2013-07-17 DIAGNOSIS — F0390 Unspecified dementia without behavioral disturbance: Secondary | ICD-10-CM

## 2013-07-17 DIAGNOSIS — J8410 Pulmonary fibrosis, unspecified: Secondary | ICD-10-CM

## 2013-07-17 LAB — CBC WITH DIFFERENTIAL/PLATELET
Basophils Relative: 0 % (ref 0–1)
Hemoglobin: 13.6 g/dL (ref 12.0–15.0)
Lymphocytes Relative: 23 % (ref 12–46)
Lymphs Abs: 3.1 10*3/uL (ref 0.7–4.0)
Monocytes Relative: 7 % (ref 3–12)
Neutro Abs: 9.6 10*3/uL — ABNORMAL HIGH (ref 1.7–7.7)
Neutrophils Relative %: 69 % (ref 43–77)
RBC: 4.53 MIL/uL (ref 3.87–5.11)
WBC: 13.9 10*3/uL — ABNORMAL HIGH (ref 4.0–10.5)

## 2013-07-17 LAB — POCT URINALYSIS DIPSTICK
Nitrite, UA: NEGATIVE
pH, UA: 6.5

## 2013-07-17 LAB — BASIC METABOLIC PANEL
Calcium: 9.9 mg/dL (ref 8.4–10.5)
Creat: 0.82 mg/dL (ref 0.50–1.10)

## 2013-07-17 MED ORDER — CIPROFLOXACIN HCL 250 MG PO TABS: 250.0000 mg | ORAL_TABLET | Freq: Two times a day (BID) | ORAL | Status: DC

## 2013-07-17 NOTE — Assessment & Plan Note (Signed)
H/o  recurrent UTI, urine looks turbid. She is on daily Bactrim. Plan: D/c bactrim cipro

## 2013-07-17 NOTE — Assessment & Plan Note (Addendum)
The patient has chronic leukocytosis, this definitely could be a sort of malignancy, ?CML. I discussed with patient and her daughter, told him that to find out she will have to see one of the specialists, probably do some testing, bone marrow biopsy?, take other medications?. The patient is not interested in pursuing further treatment , The patient's daughter has not rule out that completely. We agreed to recheck a CBC, and decide later.

## 2013-07-17 NOTE — Assessment & Plan Note (Addendum)
Continue with oxygen and inhalers. Noted to be short of breath with minimal activity. Plan-- no change

## 2013-07-17 NOTE — Assessment & Plan Note (Addendum)
No evidence of volume overload. Labs

## 2013-07-17 NOTE — Progress Notes (Signed)
  Subjective:    Patient ID: [name], female    DOB: 23-Dec-1919, 79 y.o.   MRN: [id number]  HPI Acute visit "I know I have a UTI" patient reports symptoms started about a week ago with dysuria. Since she is here, and also discussed other issues: Hypertension, BP is checked at [facility] and is always told is okay. History of pulmonary fibrosis, uses oxygen consistently at night and sometimes during the day. Last CBC showed leukocytosis, this was discussed with the patient and her daughter who is here with her, see a/p  Past Medical History:   Dementia   Osteoarthritis with chronic neck pain   Hyperlipidemia   GERD   Macular degeneration-- L blindness, R: peripheral vision only   Gait disorder   CV:   ---Ischemic cardiomyopathy. This was presumed based on EKG and echocardiogram. Her EKG shows evidence for an old anterior MI. Most recent echo in 1/11 with EF 30%, apical aneurysm, mild LVH, grade I diastolic dysfunction, mild MR, no AS.   ---Possible TIAs   PULMONARY   ---Mild aspiration per swallow test 1-11   ---Pulmonary fibrosis--COPD Patient was on home O2 at one point at night   --- question of whether her pulmonary fibrosis was triggered as an adverse reaction to Macrodantin.   Chronic UTIs, Sepsis- UTI w/ MS changes 10-09   Status post left hip fracture with pinning in September 2009.   History of falls.   .   Past Surgical History:   Cholecystectomy   Hysterectomy   Oophorectomy   Spinal fusion,x 3 , lower spine   hip fracture 07-2008    Social History:   the patient is a resident of [facility].   widow   Daughter Dione Booze lives in [place], smoked for 30 years but quit over 30 years ago. Nondrinker.   Has no history of drug abuse.    Review of Systems No recent fever or chills. No nausea or vomiting, occasional he has diarrhea. No blood in the urine additionally the urine is dark. History of CHF, reports that sometimes she has  chest pain, this is going on for years. She also mentioned her left breast is tender and "getting smaller", no mass per self breast exam     Objective:   Physical Exam BP 120/70  Pulse 66  Temp(Src) 98.8 F (37.1 C) (Oral)  Wt 140 lb (63.504 kg)  BMI 20.67 kg/m2  SpO2 93%  General -- alert, well-developed.  Neck --no JVD  Breast-- no dominant mass, skin and nipples normal to inspection on palpation, axillary areas without mass or lymphadenopathy Lungs -- dry crackles B. Did get slightly short of breath when she got up on the table. Heart-- normal rate, regular rhythm, no murmur.  Abdomen-- Not distended, good bowel sounds,soft, mild tendeness at B lower abdomen, no mass or rebound or rigidity. Extremities-- no pretibial edema bilaterally  Neurologic-- alert, fluent peech, gait appropriate for age and at baseline.  Psych-- not anxious appearing and not depressed appearing. No formal mental exam is performed but she is pleasant , follow commands and seems to have an understanding of where and why she is here. Able to describe sx .        Assessment & Plan:   Breast exam performed d/t breast complaints--->  normal.

## 2013-07-17 NOTE — Patient Instructions (Addendum)
Stop Bactrim Start Cipro 250 mg one tablet twice a day for 10 days Drink plenty of fluids Call or come back anytime if you have fever, chills. Next visit in 2 or 3 months

## 2013-07-17 NOTE — Assessment & Plan Note (Signed)
Carries the  diagnosis of dementia for many years, no recent formal mental exam, hopefully will be able to do that when she comes back. That being said, she has a clear understanding of why she is in the office and she is able to describe her  symptoms and responds to the ROS. History of intolerance to Aricept

## 2013-07-21 ENCOUNTER — Telehealth: Payer: Self-pay | Admitting: Internal Medicine

## 2013-07-21 DIAGNOSIS — N390 Urinary tract infection, site not specified: Secondary | ICD-10-CM

## 2013-07-21 LAB — URINE CULTURE: Colony Count: >=100000

## 2013-07-21 MED ORDER — NITROFURANTOIN MACROCRYSTAL 50 MG PO CAPS: ORAL_CAPSULE | ORAL | Status: DC

## 2013-07-21 NOTE — Telephone Encounter (Signed)
Rx for Nitrofuratonin sent to [facility] at [facility], dtr made aware. Copy of lab results note faxed to [facility].

## 2013-07-21 NOTE — Telephone Encounter (Signed)
Advise pt's daugter: Urine culture consistent with UTI. Chart reviewed, last seen by urology 2009, at that time she had a cystoscopy which was essentially unremarkable. Urodynamic study hypotonic bladder with incomplete emptying. Please discussed the following plan with the patient's daughter and the staff that perhaps --Discontinue Cipro --Nitrofurantoin 50 mg 2 tablets twice a day for one week, then one tablet daily #60, 3 RF --Daily  in and out bladder catheterization. --Estrogen cream Monday Wednesday and Friday to the vaginal area. # 1 tube, 3 RF Elavetd WBC WBCs still elevated at about the same level, hematology referral is an option  Next visit in 2 months

## 2013-07-23 ENCOUNTER — Telehealth: Payer: Self-pay | Admitting: Internal Medicine

## 2013-07-23 ENCOUNTER — Telehealth: Payer: Self-pay | Admitting: *Deleted

## 2013-07-23 NOTE — Telephone Encounter (Signed)
Ok #30 and 4 RFs

## 2013-07-23 NOTE — Telephone Encounter (Signed)
Refill request for -Ambien 5 mg Last ov- 07/17/13 Last date filled-#30  0 R Last UDS- 02/24/13 Low risk  Please Advise   Ag cma

## 2013-07-23 NOTE — Telephone Encounter (Signed)
[name] with [facility] is calling because she believes that the patient has contracted scabies. They are requesting that something to be called in for her. Please advise. She can be reached at [phone].

## 2013-07-24 ENCOUNTER — Other Ambulatory Visit: Payer: Self-pay | Admitting: *Deleted

## 2013-07-24 DIAGNOSIS — B86 Scabies: Secondary | ICD-10-CM

## 2013-07-24 MED ORDER — PERMETHRIN 5 % EX CREA: TOPICAL_CREAM | Freq: Once | CUTANEOUS | Status: DC

## 2013-07-24 NOTE — Telephone Encounter (Signed)
Spoke with Corrie Dandy at [facility], advised that we would send Rx for emilite cream. Instructed Mary to apply to pt from chin to toes, leave on overnight and wash off the next morning. I also advised that all sheets and bed linens be washed to avoid re infestation. She verbalized understanding of instructions.   Rx for emilite cream sent to [facility]

## 2013-07-24 NOTE — Telephone Encounter (Addendum)
If they are pretty certain she has a scabies then send a Rx for Elimite (ready to be sent) If they are not sure, the patient has fever or chills then she needs to be seen ASAP. They need to change all her bed sheets and probably treat the entire community

## 2013-07-24 NOTE — Addendum Note (Signed)
Addended by: Willow Ora E on: 07/24/2013 01:14 PM   Modules accepted: Orders

## 2013-07-25 MED ORDER — PERMETHRIN 5 % EX CREA: TOPICAL_CREAM | Freq: Once | CUTANEOUS | Status: DC

## 2013-07-25 NOTE — Addendum Note (Signed)
Addended by: Willow Ora E on: 07/25/2013 04:01 PM   Modules accepted: Orders

## 2013-07-27 ENCOUNTER — Other Ambulatory Visit: Payer: Self-pay | Admitting: *Deleted

## 2013-07-27 MED ORDER — ZOLPIDEM TARTRATE 5 MG PO TABS: 5.0000 mg | ORAL_TABLET | Freq: Every evening | ORAL | Status: DC | PRN

## 2013-07-27 NOTE — Telephone Encounter (Signed)
Rx for Zolpidem has been printed and waiting to be faxed.  Ag cma

## 2013-07-29 ENCOUNTER — Telehealth: Payer: Self-pay | Admitting: *Deleted

## 2013-07-29 MED ORDER — LORAZEPAM 0.5 MG PO TABS: ORAL_TABLET | ORAL | Status: DC

## 2013-07-29 NOTE — Telephone Encounter (Signed)
Rx refill request for Lorazepam 0.5 mg Last ov 07/17/13 Last date filled 03/24/13 #60 3 R Last UDS 02/24/13 Low risk Has contract on file  Please Advise    Ag cma

## 2013-07-29 NOTE — Telephone Encounter (Signed)
Ok 60, 3 RF 

## 2013-07-29 NOTE — Telephone Encounter (Signed)
Rx has been printed.  Ag cma  

## 2013-07-31 ENCOUNTER — Telehealth: Payer: Self-pay | Admitting: *Deleted

## 2013-07-31 DIAGNOSIS — N390 Urinary tract infection, site not specified: Secondary | ICD-10-CM

## 2013-07-31 NOTE — Telephone Encounter (Signed)
Will await for results of the urine culture, she is supposed to be on nitrofurantoin  .

## 2013-07-31 NOTE — Telephone Encounter (Signed)
Received call from [facility] at [facility] [phone] stating that pt is still having urinary frequency and feels she still has a UTI. I advsied staff to obtain a urine specimen, send it for culture and advised [place] of results when available.  They state that pt is refusing daily catheterization due to pain and cost. They are also requesting that the bactrim be restarted as well.

## 2013-08-03 MED ORDER — NITROFURANTOIN MACROCRYSTAL 50 MG PO CAPS: ORAL_CAPSULE | ORAL | Status: DC

## 2013-08-03 NOTE — Telephone Encounter (Signed)
Spoke with Corrie Dandy at [facility], she states they never received order for Nitrofurantonin so pt has not received treatment for UTI. Medication order and culture results printed and faxed to facility.Rx also called to [facility] as staff state they never received the original one.

## 2013-08-17 ENCOUNTER — Telehealth: Payer: Self-pay | Admitting: *Deleted

## 2013-08-17 NOTE — Telephone Encounter (Signed)
Copies of verbal orders received from [facility] for MD signature. Copy of signed documents sent to be scanned into pts EMR.

## 2013-08-21 ENCOUNTER — Encounter: Payer: Self-pay | Admitting: Family Medicine

## 2013-08-21 ENCOUNTER — Ambulatory Visit (INDEPENDENT_AMBULATORY_CARE_PROVIDER_SITE_OTHER): Payer: Medicare Other | Admitting: Family Medicine

## 2013-08-21 VITALS — BP 120/70 | HR 64 | Temp 98.1°F | Wt 139.0 lb

## 2013-08-21 DIAGNOSIS — R319 Hematuria, unspecified: Secondary | ICD-10-CM

## 2013-08-21 DIAGNOSIS — N390 Urinary tract infection, site not specified: Secondary | ICD-10-CM

## 2013-08-21 LAB — POCT URINALYSIS DIPSTICK
Glucose, UA: NEGATIVE
Nitrite, UA: NEGATIVE
Urobilinogen, UA: 0.2
pH, UA: 7

## 2013-08-21 MED ORDER — CEFTRIAXONE SODIUM 1 G IJ SOLR
1.0000 g | Freq: Once | INTRAMUSCULAR | Status: AC
  Administered 2013-08-21: 1 g via INTRAMUSCULAR

## 2013-08-21 MED ORDER — CEFTRIAXONE SODIUM 1 G IJ SOLR: 1.0000 g | INTRAMUSCULAR | Status: DC

## 2013-08-21 NOTE — Patient Instructions (Signed)
Urinary Tract Infection  Urinary tract infections (UTIs) can develop anywhere along your urinary tract. Your urinary tract is your body's drainage system for removing wastes and extra water. Your urinary tract includes two kidneys, two ureters, a bladder, and a urethra. Your kidneys are a pair of bean-shaped organs. Each kidney is about the size of your fist. They are located below your ribs, one on each side of your spine.  CAUSES  Infections are caused by microbes, which are microscopic organisms, including fungi, viruses, and bacteria. These organisms are so small that they can only be seen through a microscope. Bacteria are the microbes that most commonly cause UTIs.  SYMPTOMS   Symptoms of UTIs may vary by age and gender of the patient and by the location of the infection. Symptoms in young women typically include a frequent and intense urge to urinate and a painful, burning feeling in the bladder or urethra during urination. Older women and men are more likely to be tired, shaky, and weak and have muscle aches and abdominal pain. A fever may mean the infection is in your kidneys. Other symptoms of a kidney infection include pain in your back or sides below the ribs, nausea, and vomiting.  DIAGNOSIS  To diagnose a UTI, your caregiver will ask you about your symptoms. Your caregiver also will ask to provide a urine sample. The urine sample will be tested for bacteria and white blood cells. White blood cells are made by your body to help fight infection.  TREATMENT   Typically, UTIs can be treated with medication. Because most UTIs are caused by a bacterial infection, they usually can be treated with the use of antibiotics. The choice of antibiotic and length of treatment depend on your symptoms and the type of bacteria causing your infection.  HOME CARE INSTRUCTIONS   If you were prescribed antibiotics, take them exactly as your caregiver instructs you. Finish the medication even if you feel better after you  have only taken some of the medication.   Drink enough water and fluids to keep your urine clear or pale yellow.   Avoid caffeine, tea, and carbonated beverages. They tend to irritate your bladder.   Empty your bladder often. Avoid holding urine for long periods of time.   Empty your bladder before and after sexual intercourse.   After a bowel movement, women should cleanse from front to back. Use each tissue only once.  SEEK MEDICAL CARE IF:    You have back pain.   You develop a fever.   Your symptoms do not begin to resolve within 3 days.  SEEK IMMEDIATE MEDICAL CARE IF:    You have severe back pain or lower abdominal pain.   You develop chills.   You have nausea or vomiting.   You have continued burning or discomfort with urination.  MAKE SURE YOU:    Understand these instructions.   Will watch your condition.   Will get help right away if you are not doing well or get worse.  Document Released: 08/15/2005 Document Revised: 05/06/2012 Document Reviewed: 12/14/2011  ExitCare Patient Information 2014 [employer].

## 2013-08-22 NOTE — Progress Notes (Signed)
  Subjective:    [name] is a 79 y.o. female who complains of frequency. She has had symptoms for several days. Patient also complains of none. Patient denies back pain, congestion, cough, fever, headache, rhinitis, sorethroat, stomach ache and vaginal discharge. Patient does have a history of recurrent UTI. Patient does not have a history of pyelonephritis. Nursing home stopped cathing pt because she complained it hurt too much.  The following portions of the patient's history were reviewed and updated as appropriate: allergies, current medications, past family history, past medical history, past social history, past surgical history and problem list.  Review of Systems Pertinent items are noted in HPI.    Objective:    BP 120/70  Pulse 64  Temp(Src) 98.1 F (36.7 C) (Oral)  Wt 139 lb (63.05 kg)  BMI 20.52 kg/m2  SpO2 95% General appearance: alert, cooperative and no distress Abdomen: soft, non-tender; bowel sounds normal; no masses,  no organomegaly  Laboratory:  Urine dipstick: 3+ for leukocyte esterase.  3+ blood Micro exam: not done.    Assessment:    Acute cystitis and UTI     Plan:     rocephin 1g im x 7 days Recheck 7-10 days Restart cath 3x a week

## 2013-08-31 ENCOUNTER — Telehealth: Payer: Self-pay | Admitting: *Deleted

## 2013-08-31 NOTE — Telephone Encounter (Signed)
Called and spoke with nurse at [facility] concerning Dr. Leta Jungling recommendations. Understanding verbalized

## 2013-08-31 NOTE — Telephone Encounter (Signed)
She is taking coreg 6.25 twice a day, this afternoon, gave her 2 tablets of coreg instead of 1. Monitor her BP twice a day and call with readings tomorrow. ER if headache, dizziness, mental status changes, chest pain or difficulty breathing.

## 2013-08-31 NOTE — Telephone Encounter (Signed)
Received phone call from [facility] concerning patients blood pressure. The nurse stated that it is running 202/112. No other symptoms or complaints. Please advise.

## 2013-09-01 ENCOUNTER — Encounter: Payer: Self-pay | Admitting: Internal Medicine

## 2013-09-01 ENCOUNTER — Ambulatory Visit (INDEPENDENT_AMBULATORY_CARE_PROVIDER_SITE_OTHER): Payer: Medicare Other | Admitting: Internal Medicine

## 2013-09-01 VITALS — BP 174/84 | HR 77 | Temp 98.5°F | Wt 138.4 lb

## 2013-09-01 DIAGNOSIS — F329 Major depressive disorder, single episode, unspecified: Secondary | ICD-10-CM

## 2013-09-01 DIAGNOSIS — I10 Essential (primary) hypertension: Secondary | ICD-10-CM

## 2013-09-01 DIAGNOSIS — N390 Urinary tract infection, site not specified: Secondary | ICD-10-CM

## 2013-09-01 MED ORDER — ESCITALOPRAM OXALATE 5 MG PO TABS: 5.0000 mg | ORAL_TABLET | Freq: Every day | ORAL | Status: AC

## 2013-09-01 NOTE — Patient Instructions (Signed)
Start lexapro 5 mg: Half tablet daily for one week, then one tablet daily. Next visit in approximately 3 weeks

## 2013-09-01 NOTE — Assessment & Plan Note (Addendum)
BP was elevated Yesterday, better today. I sent a note back to her assisted living facility: to check BP daily and call if more than 160/90.

## 2013-09-01 NOTE — Assessment & Plan Note (Addendum)
The patient has depression for at least 2 years, not previously discuss with me. She also has suicidal ideas but states she will not follow through with those. The patient is counseled to the best of my ability, the source of her depression according to the patient is a deteriorated  relationship with G-kids and Dee for the last 2 years. She seeks counseling from a minister, declined a referral to another source of counseling. Plan: Start Lexapro 5 mg, see instructions Risk of suicidality discussed with the patient and her daughter who I asked to keep a closer eye on the patient for the next few days Reassess in 3 weeks

## 2013-09-01 NOTE — Assessment & Plan Note (Addendum)
Notes from  [facility]  reviewed. She was on Bactrim until 07/17/2013, then it was d/c. Not doing bladder catheterization 3 times a week due to pain. Recent urine culture showed Escherichia coli, status post Rocephin and keflex. Currently on nitrofurantoin and feeling better. Plan: continue w/ po abx , will call if sx (we collected a urine sample but wasn't send for UCX)

## 2013-09-01 NOTE — Progress Notes (Signed)
  Subjective:    Patient ID: [name], female    DOB: 26-May-1920, 79 y.o.   MRN: [id number]  HPI Acute visit, here with her daughter [name] her  BP  was elevated at 202/112, No other readings available, BP today is better.  Was seen by Dr. Laury Axon on 08/21/2013 With urinary symptoms, got rocephin, Urine culture showed Escherichia coli, she was prescribed Keflex  , See assessment and plan  On further questioning, she admits she is feeling poorly, when asked admits to depressoion , she says she has been depressed for more than 2 years. Admits to suicidal ideas from time to time, states she won't follow through with those ideas because of her religious believes .  Her daughter step out of the room to let the patient express her feelings, the relationships between the patient and her close family has not been good for 2 years and she feels lonely. Later on I spoke with [facility] who confirmed the situation, that has been extensively discussed among them.   Past Medical History:   Dementia   Osteoarthritis with chronic neck pain   Hyperlipidemia   GERD   Macular degeneration-- L blindness, R: peripheral vision only   Gait disorder   CV:   ---Ischemic cardiomyopathy. This was presumed based on EKG and echocardiogram. Her EKG shows evidence for an old anterior MI. Most recent echo in 1/11 with EF 30%, apical aneurysm, mild LVH, grade I diastolic dysfunction, mild MR, no AS.   ---Possible TIAs   PULMONARY   ---Mild aspiration per swallow test 1-11   ---Pulmonary fibrosis--COPD Patient was on home O2 at one point at night   --- question of whether her pulmonary fibrosis was triggered as an adverse reaction to Macrodantin.   Chronic UTIs, Sepsis- UTI w/ MS changes 10-09   Status post left hip fracture with pinning in September 2009.   History of falls.   .   Past Surgical History:   Cholecystectomy   Hysterectomy   Oophorectomy   Spinal fusion,x 3 , lower spine   hip fracture 07-2008     Social History:   the patient is a resident of [facility].   widow   Daughter [name] lives in [place], smoked for 30 years but quit over 30 years ago. Nondrinker.   Has no history of drug abuse.    Review of Systems As far as the UTI, states that she is feeling better.    Objective:   Physical Exam BP 174/84  Pulse 77  Temp(Src) 98.5 F (36.9 C)  Wt 138 lb 6.4 oz (62.778 kg)  BMI 20.43 kg/m2  SpO2 94% General -- alert, well-developed  Lungs -- normal respiratory effort, no intercostal retractions, no accessory muscle use, and normal breath sounds.  Abdomen-- no CVA tenderness  Extremities-- no pretibial edema bilaterally  Neurologic--  alert & oriented X3. Speech normal, gait normal, strength normal in all extremities.   Psych--  Tearful but cooperative and coherent  ; oriented to place and self     Assessment & Plan:  Today , I spent more than 40 min with the patient, >50% of the time counseling the pt and daughter about the new dx of depression

## 2013-09-04 ENCOUNTER — Telehealth: Payer: Self-pay | Admitting: Internal Medicine

## 2013-09-04 ENCOUNTER — Other Ambulatory Visit: Payer: Self-pay | Admitting: *Deleted

## 2013-09-04 MED ORDER — CARVEDILOL 6.25 MG PO TABS: 6.2500 mg | ORAL_TABLET | Freq: Three times a day (TID) | ORAL | Status: AC

## 2013-09-04 NOTE — Telephone Encounter (Signed)
Please advise 

## 2013-09-04 NOTE — Telephone Encounter (Signed)
Spoke with Misty Stanley at [facility] and gave her Dr. Leta Jungling instructions.

## 2013-09-04 NOTE — Telephone Encounter (Signed)
Lisa from [facility] living called and stated that she was called if [name] blood pressure was high. Today she checked it and it was 182/94. Thanks      Phone number [phone]

## 2013-09-04 NOTE — Telephone Encounter (Signed)
Increase coreg to tid , call w/ readings next week

## 2013-09-06 NOTE — Telephone Encounter (Signed)
Addendum: Addition of BP readings were faxed few days ago: 202/112, 166/86, 146/66, 170/ 84, 182/90. Plan: same

## 2013-09-22 ENCOUNTER — Ambulatory Visit (INDEPENDENT_AMBULATORY_CARE_PROVIDER_SITE_OTHER): Payer: Medicare Other | Admitting: Internal Medicine

## 2013-09-22 VITALS — BP 167/77 | HR 71 | Temp 98.2°F

## 2013-09-22 DIAGNOSIS — N390 Urinary tract infection, site not specified: Secondary | ICD-10-CM

## 2013-09-22 DIAGNOSIS — F329 Major depressive disorder, single episode, unspecified: Secondary | ICD-10-CM

## 2013-09-22 DIAGNOSIS — I10 Essential (primary) hypertension: Secondary | ICD-10-CM

## 2013-09-22 DIAGNOSIS — F3289 Other specified depressive episodes: Secondary | ICD-10-CM

## 2013-09-22 DIAGNOSIS — Z23 Encounter for immunization: Secondary | ICD-10-CM

## 2013-09-22 DIAGNOSIS — M542 Cervicalgia: Secondary | ICD-10-CM

## 2013-09-22 NOTE — Assessment & Plan Note (Addendum)
recently, BP was elevated, I increase carvedilol from twice a day to 3 times a day. Today I spoke with [name] at the nursing home [phone], Since we change her medication her BP is running between 130/80 and 160/70. 160 is slightly elevated but I recommend not to change meds at this time, risk of hypotension  is greater than mild BP elevation and she is closely monitored

## 2013-09-22 NOTE — Patient Instructions (Signed)
Continue with same medications Check your blood pressures 4 times a week,  be sure it is between 110/60 and 160/85 . If it is consistently higher or lower, let me know  Next visit in 3-4 months  for a   follow up (30 minutes) No Fasting Please make an appointment

## 2013-09-22 NOTE — Assessment & Plan Note (Signed)
On Lexapro 5 mg, improving, see history of present illness. No change

## 2013-09-22 NOTE — Assessment & Plan Note (Addendum)
Symptoms at baseline, I spoke with [facility] add a nursing home, patient refusing bladder catheterizations. Will DC catheterizations.

## 2013-09-22 NOTE — Progress Notes (Signed)
  Subjective:    Patient ID: [name], female    DOB: September 24, 1920, 79 y.o.   MRN: [id number]  HPI Followup from previous visit Depression, on Lexapro, daughter reports she looks better, patient states she feels some better. Hypertension, recently his BP was elevated, see phone calls, we increase carvedilol dose to 3 times a day, I spoke with the nurse at the nursing home and BPs are better, see assessment and plan. The patient's daughter wonders about hydrocodone versus Ultram, Ultram be a better alternative w/ less  sedation?.  Past Medical History:   Dementia   Osteoarthritis with chronic neck pain   Hyperlipidemia   GERD   Macular degeneration-- L blindness, R: peripheral vision only   Gait disorder   CV:   ---Ischemic cardiomyopathy. This was presumed based on EKG and echocardiogram. Her EKG shows evidence for an old anterior MI. Most recent echo in 1/11 with EF 30%, apical aneurysm, mild LVH, grade I diastolic dysfunction, mild MR, no AS.   ---Possible TIAs   PULMONARY   ---Mild aspiration per swallow test 1-11   ---Pulmonary fibrosis--COPD Patient was on home O2 at one point at night   --- question of whether her pulmonary fibrosis was triggered as an adverse reaction to Macrodantin.   Chronic UTIs, Sepsis- UTI w/ MS changes 10-09   Status post left hip fracture with pinning in September 2009.   History of falls.   .   Past Surgical History:   Cholecystectomy   Hysterectomy   Oophorectomy   Spinal fusion,x 3 , lower spine   hip fracture 07-2008    Social History:   the patient is a resident of [facility].   widow   Daughter [name] lives in [place], smoked for 30 years but quit over 30 years ago. Nondrinker.   Has no history of drug abuse.     Review of Systems the patient denies suicidal thoughts,states that her faith helps. Mild dysuria and urinary frequency at baseline. Nursing home reported that the patient is refusing  bladder catheterizations.     Objective:   Physical Exam  BP 167/77  Pulse 71  Temp(Src) 98.2 F (36.8 C)  SpO2 93% General -- alert, well-developed, NAD.  Psych-- seems in better spirits. No anxious appearing , no depressed appearing.     Assessment & Plan:  Today , I spent more than 15 min with the patient, >50% of the time counseling, and coordinating her care

## 2013-09-22 NOTE — Assessment & Plan Note (Signed)
Patient's daughter wonders if ultram would be a better alternative to hydrocodone for pain mngmt, less sedation?. I can not promise it would be less sedating but we could try, after our conversation the patient decided not to change anything at this time.

## 2013-09-23 ENCOUNTER — Encounter: Payer: Self-pay | Admitting: Internal Medicine

## 2013-09-28 ENCOUNTER — Telehealth: Payer: Self-pay | Admitting: *Deleted
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
## 2013-09-29 ENCOUNTER — Encounter: Payer: Self-pay | Admitting: Internal Medicine

## 2013-10-03 ENCOUNTER — Emergency Department (HOSPITAL_COMMUNITY)
Admission: EM | Admit: 2013-10-03 | Discharge: 2013-10-04 | Disposition: A | Discharge status: Skilled Nursing Facility | Payer: Medicare Other | Attending: Emergency Medicine | Admitting: Emergency Medicine

## 2013-10-03 ENCOUNTER — Encounter (HOSPITAL_COMMUNITY): Payer: Self-pay | Admitting: Emergency Medicine

## 2013-10-03 DIAGNOSIS — M1990 Unspecified osteoarthritis, unspecified site: Secondary | ICD-10-CM | POA: Insufficient documentation

## 2013-10-03 DIAGNOSIS — Z8701 Personal history of pneumonia (recurrent): Secondary | ICD-10-CM | POA: Insufficient documentation

## 2013-10-03 DIAGNOSIS — R296 Repeated falls: Secondary | ICD-10-CM | POA: Insufficient documentation

## 2013-10-03 DIAGNOSIS — G4700 Insomnia, unspecified: Secondary | ICD-10-CM | POA: Insufficient documentation

## 2013-10-03 DIAGNOSIS — E7800 Pure hypercholesterolemia, unspecified: Secondary | ICD-10-CM | POA: Insufficient documentation

## 2013-10-03 DIAGNOSIS — H3530 Unspecified macular degeneration: Secondary | ICD-10-CM | POA: Insufficient documentation

## 2013-10-03 DIAGNOSIS — K219 Gastro-esophageal reflux disease without esophagitis: Secondary | ICD-10-CM | POA: Insufficient documentation

## 2013-10-03 DIAGNOSIS — E785 Hyperlipidemia, unspecified: Secondary | ICD-10-CM | POA: Insufficient documentation

## 2013-10-03 DIAGNOSIS — Z79899 Other long term (current) drug therapy: Secondary | ICD-10-CM | POA: Insufficient documentation

## 2013-10-03 DIAGNOSIS — Z87891 Personal history of nicotine dependence: Secondary | ICD-10-CM | POA: Insufficient documentation

## 2013-10-03 DIAGNOSIS — Z7983 Long term (current) use of bisphosphonates: Secondary | ICD-10-CM | POA: Insufficient documentation

## 2013-10-03 DIAGNOSIS — K5900 Constipation, unspecified: Secondary | ICD-10-CM | POA: Insufficient documentation

## 2013-10-03 DIAGNOSIS — Z8781 Personal history of (healed) traumatic fracture: Secondary | ICD-10-CM | POA: Insufficient documentation

## 2013-10-03 DIAGNOSIS — I2510 Atherosclerotic heart disease of native coronary artery without angina pectoris: Secondary | ICD-10-CM | POA: Insufficient documentation

## 2013-10-03 DIAGNOSIS — J449 Chronic obstructive pulmonary disease, unspecified: Secondary | ICD-10-CM | POA: Insufficient documentation

## 2013-10-03 DIAGNOSIS — S20212A Contusion of left front wall of thorax, initial encounter: Secondary | ICD-10-CM

## 2013-10-03 DIAGNOSIS — Z86718 Personal history of other venous thrombosis and embolism: Secondary | ICD-10-CM | POA: Insufficient documentation

## 2013-10-03 DIAGNOSIS — Z9889 Other specified postprocedural states: Secondary | ICD-10-CM | POA: Insufficient documentation

## 2013-10-03 DIAGNOSIS — Z8673 Personal history of transient ischemic attack (TIA), and cerebral infarction without residual deficits: Secondary | ICD-10-CM | POA: Insufficient documentation

## 2013-10-03 DIAGNOSIS — Z872 Personal history of diseases of the skin and subcutaneous tissue: Secondary | ICD-10-CM | POA: Insufficient documentation

## 2013-10-03 DIAGNOSIS — Z7982 Long term (current) use of aspirin: Secondary | ICD-10-CM | POA: Insufficient documentation

## 2013-10-03 DIAGNOSIS — R011 Cardiac murmur, unspecified: Secondary | ICD-10-CM | POA: Insufficient documentation

## 2013-10-03 DIAGNOSIS — Z87440 Personal history of urinary (tract) infections: Secondary | ICD-10-CM | POA: Insufficient documentation

## 2013-10-03 DIAGNOSIS — I509 Heart failure, unspecified: Secondary | ICD-10-CM | POA: Insufficient documentation

## 2013-10-03 DIAGNOSIS — M542 Cervicalgia: Secondary | ICD-10-CM | POA: Insufficient documentation

## 2013-10-03 DIAGNOSIS — W19XXXA Unspecified fall, initial encounter: Secondary | ICD-10-CM

## 2013-10-03 DIAGNOSIS — J4489 Other specified chronic obstructive pulmonary disease: Secondary | ICD-10-CM | POA: Insufficient documentation

## 2013-10-03 DIAGNOSIS — F0390 Unspecified dementia without behavioral disturbance: Secondary | ICD-10-CM | POA: Insufficient documentation

## 2013-10-03 DIAGNOSIS — S20219A Contusion of unspecified front wall of thorax, initial encounter: Secondary | ICD-10-CM | POA: Insufficient documentation

## 2013-10-03 DIAGNOSIS — Y9301 Activity, walking, marching and hiking: Secondary | ICD-10-CM | POA: Insufficient documentation

## 2013-10-03 DIAGNOSIS — IMO0002 Reserved for concepts with insufficient information to code with codable children: Secondary | ICD-10-CM | POA: Insufficient documentation

## 2013-10-03 DIAGNOSIS — R443 Hallucinations, unspecified: Secondary | ICD-10-CM | POA: Insufficient documentation

## 2013-10-03 DIAGNOSIS — Y9210 Unspecified residential institution as the place of occurrence of the external cause: Secondary | ICD-10-CM | POA: Insufficient documentation

## 2013-10-03 DIAGNOSIS — G8929 Other chronic pain: Secondary | ICD-10-CM | POA: Insufficient documentation

## 2013-10-03 NOTE — ED Notes (Signed)
Per EMS: Patient fell at nursing home 2 days ago. Patient landed on L side. Pt has swelling on her L hip, pt also c/o L rib pain. No shortening or rotation assessed by EMS. Alert to self and situation. NAD, Ax4. Vitals BP-120/78, HR-66, RR-20, T-97.1, & 98% RA.

## 2013-10-04 ENCOUNTER — Emergency Department (HOSPITAL_COMMUNITY): Payer: Medicare Other

## 2013-10-04 LAB — URINALYSIS, ROUTINE W REFLEX MICROSCOPIC
Bilirubin Urine: NEGATIVE
Glucose, UA: NEGATIVE mg/dL
Ketones, ur: NEGATIVE mg/dL
pH: 6 (ref 5.0–8.0)

## 2013-10-04 LAB — BASIC METABOLIC PANEL
BUN: 17 mg/dL (ref 6–23)
CO2: 26 mEq/L (ref 19–32)
Calcium: 10.2 mg/dL (ref 8.4–10.5)
Chloride: 99 mEq/L (ref 96–112)
GFR calc non Af Amer: 77 mL/min — ABNORMAL LOW (ref >90)
Glucose, Bld: 112 mg/dL — ABNORMAL HIGH (ref 70–99)
Potassium: 4.2 mEq/L (ref 3.5–5.1)

## 2013-10-04 LAB — CBC
HCT: 39.3 % (ref 36.0–46.0)
Hemoglobin: 13.2 g/dL (ref 12.0–15.0)
MCH: 30.7 pg (ref 26.0–34.0)
MCHC: 33.6 g/dL (ref 30.0–36.0)
Platelets: 222 10*3/uL (ref 150–400)
RBC: 4.3 MIL/uL (ref 3.87–5.11)
WBC: 14.1 10*3/uL — ABNORMAL HIGH (ref 4.0–10.5)

## 2013-10-04 NOTE — ED Notes (Signed)
Pt given d/c instructions and verbalized understanding. NAD at this time.  

## 2013-10-04 NOTE — ED Notes (Signed)
Demonstrated to pt how to use incentive spirometer, pt did return demonstration and verbalized understanding.

## 2013-10-04 NOTE — ED Provider Notes (Signed)
Medical screening examination/treatment/procedure(s) were conducted as a shared visit with non-physician practitioner(s) and myself.  I personally evaluated the patient during the encounter.    Brandt Loosen, MD 10/04/13 [phone]

## 2013-10-04 NOTE — ED Provider Notes (Signed)
CSN: [id number]     Arrival date & time 10/03/13  2331 History   First MD Initiated Contact with Patient 10/04/13 0005     Chief Complaint  Patient presents with  . Fall   (Consider location/radiation/quality/duration/timing/severity/associated sxs/prior Treatment) HPI  79 year old female presenting to the emergency department [facility] for followup. Patient has chronic baseline dementia and is a level 5 caveat due to this fact. History of cancer nursing and EMS and the patient was able to recall the PA pending the patient had taken a glass of water she now onto her left side today the patient patient has been complaining of pain in her wound, left hip and lower back pain she has pain with deep palpation, movement and palpation in the area. Spoke with me was 1 the medical PACs at her assisted-living facility. He states that the patient did fall 2 days ago while trying to walk in her walker and carrying a large tibial cut from the cafeteria to eat seatbelt did not hit her at this consciousness. He documented that the patient has recently had altered mental status. He has a history of dementia and has been hallucinating about people who are not present for the past week and half. Upon review of patient's records he did have a long history of recurretn urinary tract infection as well as previous pneumonias.  Past Medical History  Diagnosis Date  . Ischemic cardiomyopathy     This was presumed based on EKG and echocardiogram. Her EKG shows evidence for an old anterior MI. 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  Marland Kitchen Pulmonary fibrosis     COPD pt was on home 02 at one point at night  . Chronic UTI      sepsis- UTI w/ MS changes 10/09  . Hip fracture     s/p w/ pinning in Sept 2009  . Fall   . TIA (transient ischemic attack)   . GERD (gastroesophageal reflux disease)   . Macular degeneration     L blindness, R: Peripheral vision only   . Hypercholesterolemia   . Dementia     senile, uncomplicated   . Osteoarthritis     chronic neck pain  . Ischemic cardiomyopathy     Presumed based on EKG and echocardiogram; EKG shows evidence for old anterior MY  . History of echocardiogram 1/11    EF 30%, apical aneurysm, mild LVH, grade I diastolic dysfunction, mild MR, no AS  . Bilateral pneumonia 04/25/2011  . CONGESTIVE HEART FAILURE 05/12/2007  . CONSTIPATION 11/07/2009  . COPD 05/12/2007  . CORONARY ARTERY DISEASE 08/01/2008  . Cough 09/29/2008  . DEMENTIA, SENILE, UNCOMPLICATED 08/15/2007  . DYSPHAGIA UNSPECIFIED 11/07/2009  . GAIT DISTURBANCE 08/17/2010  . GERD 12/31/2007  . HYPERLIPIDEMIA 05/22/2007  . INSOMNIA-SLEEP DISORDER-UNSPEC 04/28/2008  . MACULAR DEGENERATION 05/22/2007  . NECK PAIN, CHRONIC 08/12/2009  . OSTEOARTHRITIS 05/12/2007  . OTHER NONSPECIFIC FINDING EXAMINATION OF URINE 08/29/2009  . Polypharmacy 04/25/2011  . PRURITUS 05/30/2009  . PULMONARY FIBROSIS 05/22/2007  . SKIN LESION 01/27/2010  . SYSTOLIC MURMUR 11/07/2009  . TRANSIENT ISCHEMIC ATTACKS, HX OF 08/12/2009  . URINARY TRACT INFECTION, RECURRENT 05/22/2007  . VERTEBRAL FRACTURE 06/20/2010  . WEIGHT LOSS 05/30/2009   Past Surgical History  Procedure Laterality Date  . Cholecystectomy    . Abdominal hysterectomy    . Oophorectomy    . Spinal fusion      x 3, lower  spine  . Hip fracture surgery  07/2008   Family History  Problem Relation Age of Onset  . Heart disease Other    History  Substance Use Topics  . Smoking status: Former Smoker -- 30 years    Types: Cigarettes    Quit date: 11/19/1978  . Smokeless tobacco: Not on file     Comment: smoked for 30 years  . Alcohol Use: No   OB History   Grav Para Term Preterm Abortions TAB SAB Ect Mult Living                 Review of Systems  Unable to perform ROS   Allergies  Isosorbide mononitrate  Home Medications   Current Outpatient Rx  Name  Route  Sig  Dispense  Refill  . albuterol  (VENTOLIN HFA) 108 (90 BASE) MCG/ACT inhaler   Inhalation   Inhale 2 puffs into the lungs every 6 (six) hours as needed for wheezing.   1 Inhaler   1   . alendronate (FOSAMAX) 70 MG tablet   Oral   Take 1 tablet (70 mg total) by mouth every 7 (seven) days. Take with a full glass of water on an empty stomach.   12 tablet   0     OFFICE VISIT DUE NOW FOR FUTURE REFILLS.   Marland Kitchen amitriptyline (ELAVIL) 25 MG tablet   Oral   Take 25 mg by mouth at bedtime.           Marland Kitchen aspirin 81 MG EC tablet   Oral   Take 81 mg by mouth daily.           . budesonide-formoterol (SYMBICORT) 160-4.5 MCG/ACT inhaler   Inhalation   Inhale 2 puffs into the lungs 2 (two) times daily.   1 Inhaler   6   . carvedilol (COREG) 6.25 MG tablet   Oral   Take 1 tablet (6.25 mg total) by mouth 3 (three) times daily with meals.   60 tablet   1   . cholecalciferol (VITAMIN D) 1000 UNITS tablet   Oral   Take 1,000 Units by mouth daily.         . cyanocobalamin 500 MCG tablet   Oral   Take 500 mcg by mouth daily.         . cycloSPORINE (RESTASIS) 0.05 % ophthalmic emulsion   Both Eyes   Place 1 drop into both eyes 2 (two) times daily.           . digoxin (LANOXIN) 0.125 MG tablet   Oral   Take 125 mcg by mouth daily.           Marland Kitchen docusate sodium (COLACE) 100 MG capsule   Oral   Take by mouth. 1 a day as needed         . escitalopram (LEXAPRO) 5 MG tablet   Oral   Take 1 tablet (5 mg total) by mouth daily.   30 tablet   0   . esomeprazole (NEXIUM) 40 MG capsule   Oral   Take 40 mg by mouth daily before breakfast.           . estradiol (ESTRACE) 0.1 MG/GM vaginal cream   Vaginal   Place 2 g vaginally as directed.           . furosemide (LASIX) 40 MG tablet   Oral   Take 40 mg by mouth 2 (two) times daily as needed.          Marland Kitchen  guaiFENesin (ROBITUSSIN) 100 MG/5ML liquid   Oral   Take 200 mg by mouth every 8 (eight) hours.           Marland Kitchen HYDROcodone-acetaminophen  (NORCO/VICODIN) 5-325 MG per tablet   Oral   Take 1 tablet by mouth every 6 (six) hours as needed.   60 tablet   3   . lactulose (CHRONULAC) 10 GM/15ML solution   Oral   Take 15 mLs by mouth 2 (two) times daily as needed.         Marland Kitchen LORazepam (ATIVAN) 0.5 MG tablet      Take 1 tablet by mouth twice daily as needed for agitation, anxiety, or insomnia.   60 tablet   3   . losartan (COZAAR) 50 MG tablet   Oral   Take 1 tablet (50 mg total) by mouth daily.   90 tablet   1   . Multiple Vitamins-Minerals (ICAPS MV PO)   Oral   Take by mouth.           . nitroGLYCERIN (NITRODUR - DOSED IN MG/24 HR) 0.4 mg/hr   Transdermal   Place 1 patch (0.4 mg total) onto the skin every morning. Remove at bedtime   30 patch   0   . Nutritional Supplements (ENSURE ENLIVE PO)   Oral   Take by mouth 3 (three) times daily as needed.           . polyethylene glycol (MIRALAX / GLYCOLAX) packet   Oral   Take 17 g by mouth daily.           . promethazine (PHENERGAN) 12.5 MG tablet   Oral   Take 12.5 mg by mouth 2 (two) times daily as needed for nausea.          . Tamsulosin HCl (FLOMAX) 0.4 MG CAPS   Oral   Take by mouth.           . zolpidem (AMBIEN) 5 MG tablet   Oral   Take 1 tablet (5 mg total) by mouth at bedtime as needed.   30 tablet   4    BP 172/78  Pulse 59  Temp(Src) 98.1 F (36.7 C) (Oral)  Resp 16  SpO2 95% Physical Exam  Constitutional: She is oriented to person, place, and time. She appears well-developed and well-nourished. No distress.  HENT:  Head: Normocephalic and atraumatic.  Eyes: Conjunctivae are normal. No scleral icterus.  Neck: Normal range of motion.  Cardiovascular: Normal rate, regular rhythm and normal heart sounds.  Exam reveals no gallop and no friction rub.   No murmur heard. Pulmonary/Chest: Effort normal and breath sounds normal. No respiratory distress.  Abdominal: Soft. Bowel sounds are normal. She exhibits no distension and no  mass. There is no tenderness. There is no guarding.  Musculoskeletal:  BL hip ROM without pain.  TTP L lower illiac crest. TTP L posterior rib cage. No step off or crepitus. Abrasions present. Bruising over the left flank.  Neurological: She is alert and oriented to person, place, and time.  Skin: Skin is warm and dry. She is not diaphoretic.    ED Course  Procedures (including critical care time) Labs Review Labs Reviewed - No data to display Imaging Review Dg Chest 2 View  10/04/2013   CLINICAL DATA:  Status post fall; concern for chest injury.  EXAM: CHEST  2 VIEW  COMPARISON:  Chest radiograph performed 11/27/2011  FINDINGS: The lungs are well-aerated. Chronic lung changes are stable in  appearance, with diffuse scarring and left basilar airspace opacity. No definite superimposed focal airspace consolidation is seen. No pleural effusion or pneumothorax is identified.  The heart is borderline normal in size; the mediastinal contour is within normal limits. No acute osseous abnormalities are seen. A left shoulder arthroplasty is incompletely imaged but appears grossly unremarkable. There is mild chronic deformity involving the right humeral neck. The patient is status post vertebroplasty at the upper lumbar spine. Clips are noted within the right upper quadrant, reflecting prior cholecystectomy.  IMPRESSION: Stable chronic lung changes; no acute cardiopulmonary process seen. No displaced rib fractures identified.   Electronically Signed   By: Roanna Raider M.D.   On: 10/04/2013 02:08   Dg Lumbar Spine Complete  10/04/2013   CLINICAL DATA:  Status post fall; lower back pain.  EXAM: LUMBAR SPINE - COMPLETE 4+ VIEW  COMPARISON:  Lumbar spine radiographs performed 10/06/2009  FINDINGS: There is no evidence of fracture or subluxation. There is chronic flattening of vertebral body T12, with associated vertebroplasty. There is grade 1 anterolisthesis of L5 on S1, reflecting underlying facet disease.  Remaining vertebral bodies demonstrate normal height and alignment.  The visualized bowel gas pattern is unremarkable in appearance; air and stool are noted within the colon. The sacroiliac joints are within normal limits. Clips are noted within the right upper quadrant, reflecting prior cholecystectomy. Pins are noted at the left femoral neck. Diffuse calcification is noted along the abdominal aorta and its branches.  IMPRESSION: 1. No evidence of acute fracture or subluxation along the lumbar spine. Status post vertebroplasty at T12, with associated chronic compression deformity. 2. Diffuse vascular calcifications seen.   Electronically Signed   By: Roanna Raider M.D.   On: 10/04/2013 02:11   Dg Hip Complete Left  10/04/2013   CLINICAL DATA:  Status post fall; left hip pain.  EXAM: LEFT HIP - COMPLETE 2+ VIEW  COMPARISON:  None.  FINDINGS: There is no evidence of fracture or dislocation. Both femoral heads are seated normally within their respective acetabula. The proximal left femur appears intact, with three associated pins again noted at the left femoral neck. Postoperative change is seen at the pubic symphysis. No significant degenerative change is appreciated. The sacroiliac joints are unremarkable in appearance.  The visualized bowel gas pattern is grossly unremarkable in appearance. Scattered phleboliths are noted within the pelvis.  IMPRESSION: No evidence of fracture or dislocation. Left femoral hardware appears grossly intact.   Electronically Signed   By: Roanna Raider M.D.   On: 10/04/2013 02:05    EKG Interpretation   None       MDM   1. Fall, initial encounter   2. Rib contusion, left, initial encounter    imaging is pending. Basic labs and will evaluate for AMS.  2:58 AM Patient cbc with elevated whit count. Mild hyperglycemia/hyponatremia. Patient imaging negative for acute abnormality Appears safe for discharge at this time. She has had not hallucinations here in the  ED.Marland Kitchen She is to have close follow up at her facility with her pcp. I have provided the patient with incentive spirometer for her chest wall injury as she has a history of pneumonia. The patient appears reasonably screened and/or stabilized for discharge and I doubt any other medical condition or other [facility] requiring further screening, evaluation, or treatment in the ED at this time prior to discharge.    Arthor Captain, PA-C 10/04/13 1430  Arthor Captain, PA-C 10/04/13 1431

## 2013-10-04 NOTE — ED Provider Notes (Signed)
Very pleasant 79 yo woman with multiple chronic medical problems who presents following mechanical fall. I have reviewed MLP documentation and agree. The patient is without complaints at this time. She is mildly hypertensive and stable on baseline 2L of 02. CT brain, c spine and xray of the chest and pelvis are unremarkable. Mild leukocytosis noted which is most likely reactive. Normal U/A. Afebrile. Stable for discharge.    Brandt Loosen, MD 10/04/13 [phone]

## 2013-10-04 NOTE — ED Notes (Signed)
Pt d/c to [facility]. Facility notified.

## 2013-10-04 NOTE — ED Provider Notes (Signed)
Medical screening examination/treatment/procedure(s) were performed by non-physician practitioner and as supervising physician I was immediately available for consultation/collaboration.    Sunnie Nielsen, MD 10/04/13 [phone]

## 2013-10-05 ENCOUNTER — Telehealth: Payer: Self-pay | Admitting: *Deleted

## 2013-10-05 NOTE — Telephone Encounter (Signed)
Caller: Minerva Ends; PCP: Willow Ora; CB#: [phone]; Call regarding Left side, left hip pain; [place] Med Tech from [facility] states Quentin fell on 10/01/13. c/o severe left hip and left side pain unrelieved by Hydrocodone 5/325 mg q6h for pain. Unable to stand up. Per hip non injury protocol has ED disposition due to unbearable pain. Per facility standing orders may send to ED if xrays are needed. Advised to send to ED.Order faxed to facility - [phone]. Facility will notify family.

## 2013-10-12 ENCOUNTER — Telehealth: Payer: Self-pay | Admitting: *Deleted

## 2013-10-12 MED ORDER — LORAZEPAM 0.5 MG PO TABS: ORAL_TABLET | ORAL | Status: AC

## 2013-10-12 NOTE — Telephone Encounter (Signed)
60 and 1 RF , rx printed

## 2013-10-12 NOTE — Telephone Encounter (Signed)
Faxed. [name]

## 2013-10-12 NOTE — Telephone Encounter (Signed)
Misty Stanley from [facility] called requesting a refill on Lorazepam, states that pt is completely out of this medication and no refills remain.  Last OV 09/22/13 Last filled 07/29/13 #60 with 3 refills UDS on 08/21/13 Low Risk Okay to refill?

## 2013-10-17 ENCOUNTER — Emergency Department (HOSPITAL_COMMUNITY): Payer: Medicare Other

## 2013-10-17 ENCOUNTER — Inpatient Hospital Stay (HOSPITAL_COMMUNITY)
Admission: EM | Admit: 2013-10-17 | Discharge: 2013-10-19 | DRG: 084 | Disposition: E | Payer: Medicare Other | Attending: Internal Medicine | Admitting: Internal Medicine

## 2013-10-17 ENCOUNTER — Encounter (HOSPITAL_COMMUNITY): Payer: Self-pay | Admitting: Emergency Medicine

## 2013-10-17 DIAGNOSIS — J449 Chronic obstructive pulmonary disease, unspecified: Secondary | ICD-10-CM | POA: Diagnosis present

## 2013-10-17 DIAGNOSIS — H3530 Unspecified macular degeneration: Secondary | ICD-10-CM | POA: Diagnosis present

## 2013-10-17 DIAGNOSIS — Z79899 Other long term (current) drug therapy: Secondary | ICD-10-CM

## 2013-10-17 DIAGNOSIS — J4489 Other specified chronic obstructive pulmonary disease: Secondary | ICD-10-CM | POA: Diagnosis present

## 2013-10-17 DIAGNOSIS — J8410 Pulmonary fibrosis, unspecified: Secondary | ICD-10-CM | POA: Diagnosis present

## 2013-10-17 DIAGNOSIS — Z66 Do not resuscitate: Secondary | ICD-10-CM | POA: Diagnosis present

## 2013-10-17 DIAGNOSIS — R0600 Dyspnea, unspecified: Secondary | ICD-10-CM

## 2013-10-17 DIAGNOSIS — Y9210 Unspecified residential institution as the place of occurrence of the external cause: Secondary | ICD-10-CM | POA: Diagnosis present

## 2013-10-17 DIAGNOSIS — S0180XA Unspecified open wound of other part of head, initial encounter: Secondary | ICD-10-CM | POA: Diagnosis present

## 2013-10-17 DIAGNOSIS — I252 Old myocardial infarction: Secondary | ICD-10-CM

## 2013-10-17 DIAGNOSIS — K219 Gastro-esophageal reflux disease without esophagitis: Secondary | ICD-10-CM | POA: Diagnosis present

## 2013-10-17 DIAGNOSIS — R51 Headache: Secondary | ICD-10-CM

## 2013-10-17 DIAGNOSIS — Z87440 Personal history of urinary (tract) infections: Secondary | ICD-10-CM

## 2013-10-17 DIAGNOSIS — Z981 Arthrodesis status: Secondary | ICD-10-CM

## 2013-10-17 DIAGNOSIS — R0681 Apnea, not elsewhere classified: Secondary | ICD-10-CM | POA: Diagnosis not present

## 2013-10-17 DIAGNOSIS — Z7982 Long term (current) use of aspirin: Secondary | ICD-10-CM

## 2013-10-17 DIAGNOSIS — H5440 Blindness, one eye, unspecified eye: Secondary | ICD-10-CM | POA: Diagnosis present

## 2013-10-17 DIAGNOSIS — E785 Hyperlipidemia, unspecified: Secondary | ICD-10-CM | POA: Diagnosis present

## 2013-10-17 DIAGNOSIS — I2589 Other forms of chronic ischemic heart disease: Secondary | ICD-10-CM | POA: Diagnosis present

## 2013-10-17 DIAGNOSIS — W108XXA Fall (on) (from) other stairs and steps, initial encounter: Secondary | ICD-10-CM | POA: Diagnosis present

## 2013-10-17 DIAGNOSIS — F0390 Unspecified dementia without behavioral disturbance: Secondary | ICD-10-CM | POA: Diagnosis present

## 2013-10-17 DIAGNOSIS — Z515 Encounter for palliative care: Secondary | ICD-10-CM

## 2013-10-17 DIAGNOSIS — W19XXXA Unspecified fall, initial encounter: Secondary | ICD-10-CM | POA: Diagnosis present

## 2013-10-17 DIAGNOSIS — Z8673 Personal history of transient ischemic attack (TIA), and cerebral infarction without residual deficits: Secondary | ICD-10-CM

## 2013-10-17 DIAGNOSIS — Z87891 Personal history of nicotine dependence: Secondary | ICD-10-CM

## 2013-10-17 DIAGNOSIS — I2510 Atherosclerotic heart disease of native coronary artery without angina pectoris: Secondary | ICD-10-CM | POA: Diagnosis present

## 2013-10-17 DIAGNOSIS — S066X9A Traumatic subarachnoid hemorrhage with loss of consciousness of unspecified duration, initial encounter: Principal | ICD-10-CM | POA: Diagnosis present

## 2013-10-17 DIAGNOSIS — M1990 Unspecified osteoarthritis, unspecified site: Secondary | ICD-10-CM | POA: Diagnosis present

## 2013-10-17 DIAGNOSIS — R0609 Other forms of dyspnea: Secondary | ICD-10-CM

## 2013-10-17 DIAGNOSIS — I609 Nontraumatic subarachnoid hemorrhage, unspecified: Secondary | ICD-10-CM

## 2013-10-17 LAB — URINALYSIS, ROUTINE W REFLEX MICROSCOPIC
Glucose, UA: NEGATIVE mg/dL
Ketones, ur: NEGATIVE mg/dL
Leukocytes, UA: NEGATIVE
Protein, ur: 30 mg/dL — AB
Specific Gravity, Urine: 1.011 (ref 1.005–1.030)
Urobilinogen, UA: 0.2 mg/dL (ref 0.0–1.0)

## 2013-10-17 LAB — PROTIME-INR
INR: 1.11 (ref 0.00–1.49)
Prothrombin Time: 14.1 seconds (ref 11.6–15.2)

## 2013-10-17 LAB — CG4 I-STAT (LACTIC ACID): Lactic Acid, Venous: 2.05 mmol/L (ref 0.5–2.2)

## 2013-10-17 LAB — CBC WITH DIFFERENTIAL/PLATELET
Basophils Relative: 0 % (ref 0–1)
Eosinophils Absolute: 0 10*3/uL (ref 0.0–0.7)
HCT: 48.4 % — ABNORMAL HIGH (ref 36.0–46.0)
Hemoglobin: 16.1 g/dL — ABNORMAL HIGH (ref 12.0–15.0)
Lymphs Abs: 1.8 10*3/uL (ref 0.7–4.0)
MCH: 30.1 pg (ref 26.0–34.0)
MCHC: 33.3 g/dL (ref 30.0–36.0)
MCV: 90.6 fL (ref 78.0–100.0)
Monocytes Absolute: 1 10*3/uL (ref 0.1–1.0)
Neutro Abs: 23.4 10*3/uL — ABNORMAL HIGH (ref 1.7–7.7)
Neutrophils Relative %: 89 % — ABNORMAL HIGH (ref 43–77)
WBC: 26.2 10*3/uL — ABNORMAL HIGH (ref 4.0–10.5)

## 2013-10-17 LAB — COMPREHENSIVE METABOLIC PANEL
ALT: 11 U/L (ref 0–35)
BUN: 14 mg/dL (ref 6–23)
CO2: 25 mEq/L (ref 19–32)
Calcium: 10.5 mg/dL (ref 8.4–10.5)
Creatinine, Ser: 0.64 mg/dL (ref 0.50–1.10)
GFR calc Af Amer: 88 mL/min — ABNORMAL LOW (ref >90)
GFR calc non Af Amer: 76 mL/min — ABNORMAL LOW (ref >90)
Glucose, Bld: 108 mg/dL — ABNORMAL HIGH (ref 70–99)
Sodium: 136 mEq/L (ref 135–145)
Total Protein: 8.5 g/dL — ABNORMAL HIGH (ref 6.0–8.3)

## 2013-10-17 LAB — RAPID URINE DRUG SCREEN, HOSP PERFORMED
Barbiturates: NOT DETECTED
Cocaine: NOT DETECTED
Tetrahydrocannabinol: NOT DETECTED

## 2013-10-17 LAB — POCT I-STAT TROPONIN I: Troponin i, poc: 0.25 ng/mL (ref 0.00–0.08)

## 2013-10-17 LAB — URINE MICROSCOPIC-ADD ON

## 2013-10-17 LAB — ETHANOL: Alcohol, Ethyl (B): <11 mg/dL (ref 0–11)

## 2013-10-17 MED ORDER — MORPHINE SULFATE 25 MG/ML IV SOLN
0.5000 mg/h | INTRAVENOUS | Status: DC
  Filled 2013-10-17: qty 10

## 2013-10-17 MED ORDER — FENTANYL CITRATE 0.05 MG/ML IJ SOLN
100.0000 ug | Freq: Once | INTRAMUSCULAR | Status: AC
  Administered 2013-10-17: 100 ug via INTRAVENOUS
  Filled 2013-10-17: qty 2

## 2013-10-17 MED ORDER — MORPHINE BOLUS VIA INFUSION
1.0000 mg | INTRAVENOUS | Status: DC | PRN
  Filled 2013-10-17: qty 1

## 2013-10-17 MED ORDER — MORPHINE SULFATE 2 MG/ML IJ SOLN: 1.0000 mg | INTRAMUSCULAR | Status: DC | PRN

## 2013-10-17 MED ORDER — SODIUM CHLORIDE 0.9 % IV SOLN
INTRAVENOUS | Status: DC
  Administered 2013-10-17: 12:00:00 via INTRAVENOUS

## 2013-10-17 MED ORDER — SODIUM CHLORIDE 0.9 % IV SOLN
INTRAVENOUS | Status: DC
Start: 2013-10-17 — End: 2013-10-18
  Administered 2013-10-17: 20 mL/h via INTRAVENOUS

## 2013-10-17 MED ORDER — ATROPINE SULFATE 1 % OP SOLN: 4.0000 [drp] | OPHTHALMIC | Status: DC | PRN

## 2013-10-17 MED ORDER — LORAZEPAM 2 MG/ML IJ SOLN: 0.5000 mg | INTRAMUSCULAR | Status: DC | PRN

## 2013-10-17 MED ORDER — FENTANYL CITRATE 0.05 MG/ML IJ SOLN
50.0000 ug | Freq: Once | INTRAMUSCULAR | Status: AC
  Administered 2013-10-17: 50 ug via INTRAVENOUS
  Filled 2013-10-17: qty 2

## 2013-10-17 MED ORDER — SODIUM CHLORIDE 0.9 % IV BOLUS (SEPSIS)
500.0000 mL | Freq: Once | INTRAVENOUS | Status: AC
  Administered 2013-10-17: 500 mL via INTRAVENOUS

## 2013-10-17 MED ORDER — SODIUM CHLORIDE 0.9 % IV SOLN
0.5000 mg/h | INTRAVENOUS | Status: DC
  Administered 2013-10-17: 0.5 mg/h via INTRAVENOUS
  Filled 2013-10-17: qty 10

## 2013-10-17 MED ORDER — TETANUS-DIPHTH-ACELL PERTUSSIS 5-2.5-18.5 LF-MCG/0.5 IM SUSP
0.5000 mL | Freq: Once | INTRAMUSCULAR | Status: AC
  Administered 2013-10-17: 0.5 mL via INTRAMUSCULAR
  Filled 2013-10-17: qty 0.5

## 2013-10-17 MED ORDER — SODIUM CHLORIDE 0.9 % IV SOLN
INTRAVENOUS | Status: DC
  Administered 2013-10-17: 17:00:00 via INTRAVENOUS

## 2013-10-18 NOTE — Progress Notes (Signed)
Patient was pronounced expired at 20:17 per nurse and charge nurse. Attending physician notified. [name], daughter contacted and updated current situation at 20:48. [facility] contacted. IV Morphine wasted per Lottie Mussel Ogun-Ajala RN and Chuck Hint, RN.

## 2013-10-18 NOTE — ED Provider Notes (Signed)
CSN: [id number]     Arrival date & time 03-Nov-2013  1035 History   First MD Initiated Contact with Patient 11-03-2013 1055     Chief Complaint  Patient presents with  . Fall  . Head Injury   (Consider location/radiation/quality/duration/timing/severity/associated sxs/prior Treatment) HPI 79yo female demented patient lives at SNF is DNR found on floor at bottom of few stairs unwitnessed event apparently seen ~15 minutes prior at baseline usually able to talk now nonverbal but moving all extremities, unable to obtain further HPI or ROS due to AMS. Past Medical History  Diagnosis Date  . Ischemic cardiomyopathy     This was presumed based on EKG and echocardiogram. Her EKG shows evidence for an old anterior MI. 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  Marland Kitchen Pulmonary fibrosis     COPD pt was on home 02 at one point at night  . Chronic UTI      sepsis- UTI w/ MS changes 10/09  . Hip fracture     s/p w/ pinning in Sept 2009  . Fall   . TIA (transient ischemic attack)   . GERD (gastroesophageal reflux disease)   . Macular degeneration     L blindness, R: Peripheral vision only  . Hypercholesterolemia   . Dementia     senile, uncomplicated   . Osteoarthritis     chronic neck pain  . Ischemic cardiomyopathy     Presumed based on EKG and echocardiogram; EKG shows evidence for old anterior MY  . History of echocardiogram 1/11    EF 30%, apical aneurysm, mild LVH, grade I diastolic dysfunction, mild MR, no AS  . Bilateral pneumonia 04/25/2011  . CONGESTIVE HEART FAILURE 05/12/2007  . CONSTIPATION 11/07/2009  . COPD 05/12/2007  . CORONARY ARTERY DISEASE 08/01/2008  . Cough 09/29/2008  . DEMENTIA, SENILE, UNCOMPLICATED 08/15/2007  . DYSPHAGIA UNSPECIFIED 11/07/2009  . GAIT DISTURBANCE 08/17/2010  . GERD 12/31/2007  . HYPERLIPIDEMIA 05/22/2007  . INSOMNIA-SLEEP DISORDER-UNSPEC 04/28/2008  . MACULAR DEGENERATION 05/22/2007  . NECK  PAIN, CHRONIC 08/12/2009  . OSTEOARTHRITIS 05/12/2007  . OTHER NONSPECIFIC FINDING EXAMINATION OF URINE 08/29/2009  . Polypharmacy 04/25/2011  . PRURITUS 05/30/2009  . PULMONARY FIBROSIS 05/22/2007  . SKIN LESION 01/27/2010  . SYSTOLIC MURMUR 11/07/2009  . TRANSIENT ISCHEMIC ATTACKS, HX OF 08/12/2009  . URINARY TRACT INFECTION, RECURRENT 05/22/2007  . VERTEBRAL FRACTURE 06/20/2010  . WEIGHT LOSS 05/30/2009   Past Surgical History  Procedure Laterality Date  . Cholecystectomy    . Abdominal hysterectomy    . Oophorectomy    . Spinal fusion      x 3, lower spine  . Hip fracture surgery  07/2008   Family History  Problem Relation Age of Onset  . Heart disease Other    History  Substance Use Topics  . Smoking status: Former Smoker -- 30 years    Types: Cigarettes    Quit date: 11/19/1978  . Smokeless tobacco: Not on file     Comment: smoked for 30 years  . Alcohol Use: No   OB History   Grav Para Term Preterm Abortions TAB SAB Ect Mult Living                 Review of Systems  Unable to perform ROS: Mental status change    Allergies  Isosorbide mononitrate  Home Medications   Current Outpatient Rx  Name  Route  Sig  Dispense  Refill  . acetaminophen (TYLENOL) 325 MG tablet   Oral   Take 650 mg by mouth every 4 (four) hours as needed for mild pain.         Marland Kitchen albuterol (VENTOLIN HFA) 108 (90 BASE) MCG/ACT inhaler   Inhalation   Inhale 2 puffs into the lungs every 6 (six) hours as needed for wheezing.   1 Inhaler   1   . alendronate (FOSAMAX) 70 MG tablet   Oral   Take 1 tablet (70 mg total) by mouth every 7 (seven) days. Take with a full glass of water on an empty stomach.   12 tablet   0     OFFICE VISIT DUE NOW FOR FUTURE REFILLS.   Marland Kitchen amitriptyline (ELAVIL) 25 MG tablet   Oral   Take 25 mg by mouth at bedtime.           Marland Kitchen aspirin 81 MG EC tablet   Oral   Take 81 mg by mouth daily.           . budesonide-formoterol (SYMBICORT) 160-4.5 MCG/ACT inhaler    Inhalation   Inhale 2 puffs into the lungs 2 (two) times daily.   1 Inhaler   6   . carvedilol (COREG) 6.25 MG tablet   Oral   Take 1 tablet (6.25 mg total) by mouth 3 (three) times daily with meals.   60 tablet   1   . cholecalciferol (VITAMIN D) 1000 UNITS tablet   Oral   Take 1,000 Units by mouth daily.         . cyanocobalamin 500 MCG tablet   Oral   Take 500 mcg by mouth daily.         . cycloSPORINE (RESTASIS) 0.05 % ophthalmic emulsion   Both Eyes   Place 1 drop into both eyes 2 (two) times daily.           . digoxin (LANOXIN) 0.125 MG tablet   Oral   Take 125 mcg by mouth daily.           Marland Kitchen docusate sodium (COLACE) 100 MG capsule   Oral   Take 100 mg by mouth daily as needed for mild constipation. 1 a day as needed         . ENSURE (ENSURE)   Oral   Take 237 mLs by mouth daily as needed (nutrition supplement).         Marland Kitchen escitalopram (LEXAPRO) 5 MG tablet   Oral   Take 1 tablet (5 mg total) by mouth daily.   30 tablet   0   . esomeprazole (NEXIUM) 40 MG capsule   Oral   Take 40 mg by mouth daily before breakfast.           . estradiol (ESTRACE) 0.1 MG/GM vaginal cream   Vaginal   Place 1 Applicatorful vaginally every Monday, Wednesday, and Friday. For atrophic vaginitis         . furosemide (LASIX) 40 MG tablet   Oral   Take 40 mg by mouth daily as needed for edema.          Marland Kitchen guaiFENesin (ROBITUSSIN) 100 MG/5ML liquid   Oral   Take 200 mg by mouth 4 (four) times daily as needed for cough.          Marland Kitchen guaiFENesin 200 MG tablet   Oral   Take 200 mg by mouth every 8 (eight) hours as needed for cough or to loosen phlegm.         Marland Kitchen  HYDROcodone-acetaminophen (NORCO/VICODIN) 5-325 MG per tablet   Oral   Take 1 tablet by mouth every 6 (six) hours as needed for moderate pain.         Marland Kitchen lactulose (CHRONULAC) 10 GM/15ML solution   Oral   Take 15 mLs by mouth 2 (two) times daily as needed for mild constipation.          Marland Kitchen  LORazepam (ATIVAN) 0.5 MG tablet      Take 1 tablet by mouth twice daily as needed for agitation, anxiety, or insomnia.   60 tablet   1   . losartan (COZAAR) 50 MG tablet   Oral   Take 1 tablet (50 mg total) by mouth daily.   90 tablet   1   . Multiple Vitamins-Minerals (PRESERVISION AREDS 2 PO)   Oral   Take 1 tablet by mouth 2 (two) times daily.         . nitrofurantoin (MACRODANTIN) 50 MG capsule   Oral   Take 50 mg by mouth daily. UTI prphylaxis         . nitroGLYCERIN (NITRODUR - DOSED IN MG/24 HR) 0.4 mg/hr   Transdermal   Place 1 patch (0.4 mg total) onto the skin every morning. Remove at bedtime   30 patch   0   . polyethylene glycol (MIRALAX / GLYCOLAX) packet   Oral   Take 17 g by mouth daily.           . polyvinyl alcohol (LIQUIFILM TEARS) 1.4 % ophthalmic solution   Both Eyes   Place 1 drop into both eyes 4 (four) times daily.         . Polyvinyl Alcohol-Povidone (REFRESH OP)   Ophthalmic   Apply 1 drop to eye at bedtime.         . promethazine (PHENERGAN) 12.5 MG tablet   Oral   Take 12.5 mg by mouth 2 (two) times daily as needed for nausea.          . Tamsulosin HCl (FLOMAX) 0.4 MG CAPS   Oral   Take by mouth.           . zolpidem (AMBIEN) 5 MG tablet   Oral   Take 5 mg by mouth at bedtime as needed for sleep.          BP 209/117  Pulse 102  Temp(Src) 96.4 F (35.8 C) (Axillary)  Resp 16  Wt 129 lb 12.8 oz (58.877 kg)  SpO2 100% Physical Exam  Nursing note and vitals reviewed. Constitutional:  Eyes closed, nonverbal, but moves all 4 extremities spontaneously, not following commmands  HENT:  Ecchymosis left lower jaw; 2cm left facial lacerations x 2 lateral to left eye no FB noted; intact gag reflex  Eyes: Right eye exhibits no discharge. Left eye exhibits no discharge.  Right pupil irregular enlarged nonreactive; left pupil mildly dilated nonreactive  Neck: Neck supple.  Cardiovascular: Normal rate and regular rhythm.    No murmur heard. Pulmonary/Chest: Effort normal and breath sounds normal. No respiratory distress. She has no wheezes. She has no rales. She exhibits no tenderness.  Abdominal: Soft. She exhibits no distension. There is no tenderness. There is no rebound and no guarding.  Musculoskeletal: She exhibits no tenderness.  Moves all 4 extremities symmetrically  Neurological:  Eyes closed, nonverbal, but moves all 4 extremities spontaneously, not following commmands   Skin: No rash noted.  Psychiatric:  Unable to assess    ED Course  Procedures (including critical care  time) I saw and evaluated the patient, reviewed the resident's note and I agree with the findings and plan; present and available during lacerations repaired.  D/w family as well as NSurg, [facility], Cards, and [facility], confirmed Pt DNR/DNI/no heroic measures, NSurg recs no intervention, suspect STEMI mimic and ST elevation due to [facility], admit to palliative care.  CRITICAL CARE Performed by: Hurman Horn Total critical care time: including d/w family and multiple consults and rechecks Critical care time was exclusive of separately billable procedures and treating other patients. Critical care was necessary to treat or prevent imminent or life-threatening deterioration. Critical care was time spent personally by me on the following activities: development of treatment plan with patient and/or surrogate as well as nursing, discussions with consultants, evaluation of patient's response to treatment, examination of patient, obtaining history from patient or surrogate, ordering and performing treatments and interventions, ordering and review of laboratory studies, ordering and review of radiographic studies, pulse oximetry and re-evaluation of patient's condition.  EKG Interpretation    Date/Time:  Saturday 21-Oct-2013 12:18:14 EST Ventricular Rate:  123 PR Interval:  140 QRS Duration: 121 QT Interval:  330 QTC  Calculation: 472 R Axis:   -2 Text Interpretation:  Sinus tachycardia Probable left atrial enlargement Left ventricular hypertrophy Inferior infarct, acute (LCx) Probable anterolateral infarct, acute Compared to previous tracing ST more elevated in Diffuse leads Confirmed by [facility]  MD, Raina Sole ([id number]) on 10/21/2013 5:58:47 PM            Labs Review Labs Reviewed  CBC WITH DIFFERENTIAL - Abnormal; Notable for the following:    WBC 26.2 (*)    RBC 5.34 (*)    Hemoglobin 16.1 (*)    HCT 48.4 (*)    Neutrophils Relative % 89 (*)    Lymphocytes Relative 7 (*)    Neutro Abs 23.4 (*)    All other components within normal limits  COMPREHENSIVE METABOLIC PANEL - Abnormal; Notable for the following:    Glucose, Bld 108 (*)    Total Protein 8.5 (*)    GFR calc non Af Amer 76 (*)    GFR calc Af Amer 88 (*)    All other components within normal limits  URINALYSIS, ROUTINE W REFLEX MICROSCOPIC - Abnormal; Notable for the following:    APPearance CLOUDY (*)    Protein, ur 30 (*)    All other components within normal limits  URINE MICROSCOPIC-ADD ON - Abnormal; Notable for the following:    Squamous Epithelial / LPF FEW (*)    Bacteria, UA MANY (*)    All other components within normal limits  POCT I-STAT TROPONIN I - Abnormal; Notable for the following:    Troponin i, poc 0.25 (*)    All other components within normal limits  PROTIME-INR  ETHANOL  URINE RAPID DRUG SCREEN (HOSP PERFORMED)  CG4 I-STAT (LACTIC ACID)   Imaging Review Ct Head Wo Contrast  10-21-13   CLINICAL DATA:  Fall with head injury.  EXAM: CT HEAD WITHOUT CONTRAST  CT MAXILLOFACIAL WITHOUT CONTRAST  CT CERVICAL SPINE WITHOUT CONTRAST  TECHNIQUE: Multidetector CT imaging of the head, cervical spine, and maxillofacial structures were performed using the standard protocol without intravenous contrast. Multiplanar CT image reconstructions of the cervical spine and maxillofacial structures were also generated.  COMPARISON:   Head CT 02/25/2009  FINDINGS: CT HEAD FINDINGS  There is acute subarachnoid hemorrhage, most prominent in the basilar cisterns and scattered in some of the hemispheric sulci and  in the left sylvian fissure. There is also intraventricular hemorrhage in the left lateral ventricle. Ventricular size appears similar to a head CT of 2010. Ventricles are slightly prominent size, but chronic and likely related to the patient's diffuse cerebral atrophy. No definite acute hydrocephalus. There is no midline shift or mass effect. Chronic microvascular ischemic changes in the deep periventricular white matter are stable. No evidence of acute cortically based infarction.  The bony calvarium is intact.  CT MAXILLOFACIAL FINDINGS  There is prominent soft tissue swelling/ hematoma of the left face, most prominent lateral to the left orbit and along the left side of the mandible. The orbits are symmetric and intact. No evidence of acute oral injury. Facial bones are intact. No acute facial bone fracture. Paranasal sinuses are clear. The mastoid air cells and middle ears are clear.  CT CERVICAL SPINE FINDINGS  Cervical spine is imaged from the skullbase through the T1-T2 interspace. Vertebral bodies are normal in height and alignment. There is multilevel disc height narrowing and osteophyte formation, most prominent at C5-C6. Negative for acute cervical spine fracture. There is facet joint hypertrophy at multiple levels, resulting in some neural foraminal narrowing, most prominent bilaterally at C3-C4. The spinal canal is patent. The prevertebral soft tissue contour is normal.  IMPRESSION: 1. Acute subarachnoid and intraventricular hemorrhage. I called this critical value to Dr. Fonnie Jarvis in the Emergency department at 12:15 p.m. on 09/23/2013. 2. Chronic cerebral atrophy and mild chronic ventriculomegaly, likely ex vacuo. 3. Soft tissue swelling/hematoma formation of the left face lateral to the left orbit and adjacent to the left  mandible. 4. Negative for facial bone fracture. 5. Negative for cervical spine fracture. 6. Multilevel degenerative changes of the cervical spine.   Electronically Signed   By: Britta Mccreedy M.D.   On: 09/30/2013 12:17   Ct Cervical Spine Wo Contrast  09/25/2013   CLINICAL DATA:  Fall with head injury.  EXAM: CT HEAD WITHOUT CONTRAST  CT MAXILLOFACIAL WITHOUT CONTRAST  CT CERVICAL SPINE WITHOUT CONTRAST  TECHNIQUE: Multidetector CT imaging of the head, cervical spine, and maxillofacial structures were performed using the standard protocol without intravenous contrast. Multiplanar CT image reconstructions of the cervical spine and maxillofacial structures were also generated.  COMPARISON:  Head CT 02/25/2009  FINDINGS: CT HEAD FINDINGS  There is acute subarachnoid hemorrhage, most prominent in the basilar cisterns and scattered in some of the hemispheric sulci and in the left sylvian fissure. There is also intraventricular hemorrhage in the left lateral ventricle. Ventricular size appears similar to a head CT of 2010. Ventricles are slightly prominent size, but chronic and likely related to the patient's diffuse cerebral atrophy. No definite acute hydrocephalus. There is no midline shift or mass effect. Chronic microvascular ischemic changes in the deep periventricular white matter are stable. No evidence of acute cortically based infarction.  The bony calvarium is intact.  CT MAXILLOFACIAL FINDINGS  There is prominent soft tissue swelling/ hematoma of the left face, most prominent lateral to the left orbit and along the left side of the mandible. The orbits are symmetric and intact. No evidence of acute oral injury. Facial bones are intact. No acute facial bone fracture. Paranasal sinuses are clear. The mastoid air cells and middle ears are clear.  CT CERVICAL SPINE FINDINGS  Cervical spine is imaged from the skullbase through the T1-T2 interspace. Vertebral bodies are normal in height and alignment. There is  multilevel disc height narrowing and osteophyte formation, most prominent at C5-C6. Negative for acute cervical  spine fracture. There is facet joint hypertrophy at multiple levels, resulting in some neural foraminal narrowing, most prominent bilaterally at C3-C4. The spinal canal is patent. The prevertebral soft tissue contour is normal.  IMPRESSION: 1. Acute subarachnoid and intraventricular hemorrhage. I called this critical value to Dr. Fonnie Jarvis in the Emergency department at 12:15 p.m. on 10/13/2013. 2. Chronic cerebral atrophy and mild chronic ventriculomegaly, likely ex vacuo. 3. Soft tissue swelling/hematoma formation of the left face lateral to the left orbit and adjacent to the left mandible. 4. Negative for facial bone fracture. 5. Negative for cervical spine fracture. 6. Multilevel degenerative changes of the cervical spine.   Electronically Signed   By: Britta Mccreedy M.D.   On: 09/24/2013 12:17   Dg Pelvis Portable  10/16/2013   CLINICAL DATA:  Fall.  History of left hip pinning and 2009  EXAM: PORTABLE PELVIS 1-2 VIEWS  COMPARISON:  Pelvis radiographs 02/25/2009  FINDINGS: There is no evidence of pelvic fracture or diastasis. No other pelvic bone lesions are seen. Three screws traverse the proximal left femur, unchanged. No evidence of hardware failure. Postoperative changes in the pubic bones are stable and suggest prior pelvic suspension procedure. Probable convex left scoliosis of the lumbar spine, partially visualized. Prominent amount of stool in the rectum.  IMPRESSION: No acute bony abnormality identified. Postoperative changes of the left hip and pubic symphysis region.   Electronically Signed   By: Britta Mccreedy M.D.   On: 09/27/2013 13:02   Dg Hand 2 View Right  10/12/2013   CLINICAL DATA:  Bruising post fall  EXAM: RIGHT HAND - 2 VIEW  COMPARISON:  None.  FINDINGS: Mild osteopenia. There is no evidence of fracture or dislocation. There is no evidence of arthropathy or other focal bone  abnormality. Soft tissues are unremarkable.  IMPRESSION: Negative.   Electronically Signed   By: Oley Balm M.D.   On: 09/30/2013 13:02   Dg Chest Port 1 View  10/16/2013   CLINICAL DATA:  Larey Seat, dementia  EXAM: PORTABLE CHEST - 1 VIEW  COMPARISON:  10/04/2013  FINDINGS: Left shoulder arthroplasty hardware partially seen. Stable fracture deformities in the right shoulder. Relatively low lung volumes. Coarse interstitial and airspace opacities in both lungs with more confluent disease in the right mid lung and at the left lung base, slightly worse than on prior exam. Blunting of lateral costophrenic angle suggesting small effusions. Heart size upper limits normal for technique. Tortuous atheromatous aorta. Surgical clips in the right upper abdomen. Changes of T12 kyphoplasty.  IMPRESSION: 1. Worsening asymmetric airspace disease, possible small effusions. 2. Chronic and postoperative changes as above.   Electronically Signed   By: Oley Balm M.D.   On: 10/01/2013 13:01   Ct Maxillofacial Wo Cm  09/25/2013   CLINICAL DATA:  Fall with head injury.  EXAM: CT HEAD WITHOUT CONTRAST  CT MAXILLOFACIAL WITHOUT CONTRAST  CT CERVICAL SPINE WITHOUT CONTRAST  TECHNIQUE: Multidetector CT imaging of the head, cervical spine, and maxillofacial structures were performed using the standard protocol without intravenous contrast. Multiplanar CT image reconstructions of the cervical spine and maxillofacial structures were also generated.  COMPARISON:  Head CT 02/25/2009  FINDINGS: CT HEAD FINDINGS  There is acute subarachnoid hemorrhage, most prominent in the basilar cisterns and scattered in some of the hemispheric sulci and in the left sylvian fissure. There is also intraventricular hemorrhage in the left lateral ventricle. Ventricular size appears similar to a head CT of 2010. Ventricles are slightly prominent size, but chronic and  likely related to the patient's diffuse cerebral atrophy. No definite acute  hydrocephalus. There is no midline shift or mass effect. Chronic microvascular ischemic changes in the deep periventricular white matter are stable. No evidence of acute cortically based infarction.  The bony calvarium is intact.  CT MAXILLOFACIAL FINDINGS  There is prominent soft tissue swelling/ hematoma of the left face, most prominent lateral to the left orbit and along the left side of the mandible. The orbits are symmetric and intact. No evidence of acute oral injury. Facial bones are intact. No acute facial bone fracture. Paranasal sinuses are clear. The mastoid air cells and middle ears are clear.  CT CERVICAL SPINE FINDINGS  Cervical spine is imaged from the skullbase through the T1-T2 interspace. Vertebral bodies are normal in height and alignment. There is multilevel disc height narrowing and osteophyte formation, most prominent at C5-C6. Negative for acute cervical spine fracture. There is facet joint hypertrophy at multiple levels, resulting in some neural foraminal narrowing, most prominent bilaterally at C3-C4. The spinal canal is patent. The prevertebral soft tissue contour is normal.  IMPRESSION: 1. Acute subarachnoid and intraventricular hemorrhage. I called this critical value to Dr. Fonnie Jarvis in the Emergency department at 12:15 p.m. on 10/09/2013. 2. Chronic cerebral atrophy and mild chronic ventriculomegaly, likely ex vacuo. 3. Soft tissue swelling/hematoma formation of the left face lateral to the left orbit and adjacent to the left mandible. 4. Negative for facial bone fracture. 5. Negative for cervical spine fracture. 6. Multilevel degenerative changes of the cervical spine.   Electronically Signed   By: Britta Mccreedy M.D.   On: 10/13/2013 12:17    EKG Interpretation    Date/Time:  Saturday October 17 2013 12:18:14 EST Ventricular Rate:  123 PR Interval:  140 QRS Duration: 121 QT Interval:  330 QTC Calculation: 472 R Axis:   -2 Text Interpretation:  Sinus tachycardia Probable left  atrial enlargement Left ventricular hypertrophy Inferior infarct, acute (LCx) Probable anterolateral infarct, acute Compared to previous tracing ST more elevated in Diffuse leads Confirmed by [facility]  MD, Yordin Rhoda ([id number]) on 09/27/2013 5:58:47 PM            MDM   1. SAH (subarachnoid hemorrhage)   2. Facial pain, acute   3. Dyspnea   4. Palliative care encounter   facial lacerations Facial contusion  Pt critically ill, palliative care admit, family aware death could occur at any time.    Hurman Horn, MD 10/18/13 [phone]

## 2013-10-19 NOTE — H&P (Signed)
[facility] History and Physical  [name] GMW:[id number] DOB: April 26, 1920 DOA: 10/16/2013  Referring physician: er PCP: Willow Ora, MD  Specialists:   Chief Complaint: fall and now comfort care  HPI: [name] is a 79 y.o. female  Who was received from [facility] facility has a history of dementia.  She was found at bottom of 3 concrete steps face down. Patient was last seen by staff 15 mins prior to fall. Per records, patient was verbal with fire department on scene but has been nonverbal with EMS.   In the ER, patient was found to have SAH- no intervention per Neurosurgery Elevated ST segments- prob due to [facility]- no intervention from cards Facial lacerations to be repaired in ER  Review of Systems: unable to do   Past Medical History  Diagnosis Date  . Ischemic cardiomyopathy     This was presumed based on EKG and echocardiogram. Her EKG shows evidence for an old anterior MI. 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  Marland Kitchen Pulmonary fibrosis     COPD pt was on home 02 at one point at night  . Chronic UTI      sepsis- UTI w/ MS changes 10/09  . Hip fracture     s/p w/ pinning in Sept 2009  . Fall   . TIA (transient ischemic attack)   . GERD (gastroesophageal reflux disease)   . Macular degeneration     L blindness, R: Peripheral vision only  . Hypercholesterolemia   . Dementia     senile, uncomplicated   . Osteoarthritis     chronic neck pain  . Ischemic cardiomyopathy     Presumed based on EKG and echocardiogram; EKG shows evidence for old anterior MY  . History of echocardiogram 1/11    EF 30%, apical aneurysm, mild LVH, grade I diastolic dysfunction, mild MR, no AS  . Bilateral pneumonia 04/25/2011  . CONGESTIVE HEART FAILURE 05/12/2007  . CONSTIPATION 11/07/2009  . COPD 05/12/2007  . CORONARY ARTERY DISEASE 08/01/2008  . Cough 09/29/2008  . DEMENTIA, SENILE, UNCOMPLICATED 08/15/2007  .  DYSPHAGIA UNSPECIFIED 11/07/2009  . GAIT DISTURBANCE 08/17/2010  . GERD 12/31/2007  . HYPERLIPIDEMIA 05/22/2007  . INSOMNIA-SLEEP DISORDER-UNSPEC 04/28/2008  . MACULAR DEGENERATION 05/22/2007  . NECK PAIN, CHRONIC 08/12/2009  . OSTEOARTHRITIS 05/12/2007  . OTHER NONSPECIFIC FINDING EXAMINATION OF URINE 08/29/2009  . Polypharmacy 04/25/2011  . PRURITUS 05/30/2009  . PULMONARY FIBROSIS 05/22/2007  . SKIN LESION 01/27/2010  . SYSTOLIC MURMUR 11/07/2009  . TRANSIENT ISCHEMIC ATTACKS, HX OF 08/12/2009  . URINARY TRACT INFECTION, RECURRENT 05/22/2007  . VERTEBRAL FRACTURE 06/20/2010  . WEIGHT LOSS 05/30/2009   Past Surgical History  Procedure Laterality Date  . Cholecystectomy    . Abdominal hysterectomy    . Oophorectomy    . Spinal fusion      x 3, lower spine  . Hip fracture surgery  07/2008   Social History:  reports that she quit smoking about 34 years ago. Her smoking use included Cigarettes. She smoked 0.00 packs per day for 30 years. She does not have any smokeless tobacco history on file. She reports that she does not drink alcohol or use illicit drugs.   Allergies  Allergen Reactions  . Isosorbide Mononitrate Hives and Itching    REACTION: itching    Family History  Problem Relation Age of Onset  . Heart disease Other  Prior to Admission medications   Medication Sig Start Date End Date Taking? Authorizing Provider  acetaminophen (TYLENOL) 325 MG tablet Take 650 mg by mouth every 4 (four) hours as needed for mild pain.   Yes Historical Provider, MD  albuterol (VENTOLIN HFA) 108 (90 BASE) MCG/ACT inhaler Inhale 2 puffs into the lungs every 6 (six) hours as needed for wheezing. 12/19/11 08/21/14 Yes Wanda Plump, MD  alendronate (FOSAMAX) 70 MG tablet Take 1 tablet (70 mg total) by mouth every 7 (seven) days. Take with a full glass of water on an empty stomach. 11/05/12  Yes Wanda Plump, MD  amitriptyline (ELAVIL) 25 MG tablet Take 25 mg by mouth at bedtime.     Yes Historical Provider, MD   aspirin 81 MG EC tablet Take 81 mg by mouth daily.     Yes Historical Provider, MD  budesonide-formoterol (SYMBICORT) 160-4.5 MCG/ACT inhaler Inhale 2 puffs into the lungs 2 (two) times daily. 02/25/13 02/25/14 Yes Wanda Plump, MD  carvedilol (COREG) 6.25 MG tablet Take 1 tablet (6.25 mg total) by mouth 3 (three) times daily with meals. 09/04/13  Yes Wanda Plump, MD  cholecalciferol (VITAMIN D) 1000 UNITS tablet Take 1,000 Units by mouth daily.   Yes Historical Provider, MD  cyanocobalamin 500 MCG tablet Take 500 mcg by mouth daily.   Yes Historical Provider, MD  cycloSPORINE (RESTASIS) 0.05 % ophthalmic emulsion Place 1 drop into both eyes 2 (two) times daily.     Yes Historical Provider, MD  digoxin (LANOXIN) 0.125 MG tablet Take 125 mcg by mouth daily.     Yes Historical Provider, MD  docusate sodium (COLACE) 100 MG capsule Take 100 mg by mouth daily as needed for mild constipation. 1 a day as needed   Yes Historical Provider, MD  ENSURE (ENSURE) Take 237 mLs by mouth daily as needed (nutrition supplement).   Yes Historical Provider, MD  escitalopram (LEXAPRO) 5 MG tablet Take 1 tablet (5 mg total) by mouth daily. 09/01/13  Yes Wanda Plump, MD  esomeprazole (NEXIUM) 40 MG capsule Take 40 mg by mouth daily before breakfast.     Yes Historical Provider, MD  estradiol (ESTRACE) 0.1 MG/GM vaginal cream Place 1 Applicatorful vaginally every Monday, Wednesday, and Friday. For atrophic vaginitis   Yes Historical Provider, MD  furosemide (LASIX) 40 MG tablet Take 40 mg by mouth daily as needed for edema.    Yes Historical Provider, MD  guaiFENesin (ROBITUSSIN) 100 MG/5ML liquid Take 200 mg by mouth 4 (four) times daily as needed for cough.    Yes Historical Provider, MD  guaiFENesin 200 MG tablet Take 200 mg by mouth every 8 (eight) hours as needed for cough or to loosen phlegm.   Yes Historical Provider, MD  HYDROcodone-acetaminophen (NORCO/VICODIN) 5-325 MG per tablet Take 1 tablet by mouth every 6 (six)  hours as needed for moderate pain.   Yes Historical Provider, MD  lactulose (CHRONULAC) 10 GM/15ML solution Take 15 mLs by mouth 2 (two) times daily as needed for mild constipation.    Yes Historical Provider, MD  LORazepam (ATIVAN) 0.5 MG tablet Take 1 tablet by mouth twice daily as needed for agitation, anxiety, or insomnia. 10/12/13  Yes Wanda Plump, MD  losartan (COZAAR) 50 MG tablet Take 1 tablet (50 mg total) by mouth daily. 03/23/13  Yes Wanda Plump, MD  Multiple Vitamins-Minerals (PRESERVISION AREDS 2 PO) Take 1 tablet by mouth 2 (two) times daily.   Yes Historical Provider, MD  nitrofurantoin (MACRODANTIN) 50 MG capsule Take 50 mg by mouth daily. UTI prphylaxis   Yes Historical Provider, MD  nitroGLYCERIN (NITRODUR - DOSED IN MG/24 HR) 0.4 mg/hr Place 1 patch (0.4 mg total) onto the skin every morning. Remove at bedtime 05/26/12  Yes Wanda Plump, MD  polyethylene glycol [facility] / GLYCOLAX) packet Take 17 g by mouth daily.     Yes Historical Provider, MD  polyvinyl alcohol (LIQUIFILM TEARS) 1.4 % ophthalmic solution Place 1 drop into both eyes 4 (four) times daily.   Yes Historical Provider, MD  Polyvinyl Alcohol-Povidone (REFRESH OP) Apply 1 drop to eye at bedtime.   Yes Historical Provider, MD  promethazine (PHENERGAN) 12.5 MG tablet Take 12.5 mg by mouth 2 (two) times daily as needed for nausea.    Yes Historical Provider, MD  Tamsulosin HCl (FLOMAX) 0.4 MG CAPS Take by mouth.     Yes Historical Provider, MD  zolpidem (AMBIEN) 5 MG tablet Take 5 mg by mouth at bedtime as needed for sleep.   Yes Historical Provider, MD   Physical Exam: Filed Vitals:   October 24, 2013 1200  BP: 117/101  Pulse: 80  Resp: 27     General:  Not responsive, facial lacerations  Eyes: closed  ENT: wnl  Neck: supple  Cardiovascular: rrr  Respiratory: coarse, non-labored  Abdomen: +BS, soft  Skin: bruises and lesions  Musculoskeletal: moves all 4 ext  Psychiatric: unable to assess- obtunded  Neurologic:  will occasionally follow commands - squeeze hands etc  Labs on Admission:  Basic Metabolic Panel:  Recent Labs Lab 2013/10/24 1107  NA 136  K 4.4  CL 97  CO2 25  GLUCOSE 108*  BUN 14  CREATININE 0.64  CALCIUM 10.5   Liver Function Tests:  Recent Labs Lab 2013/10/24 1107  AST 27  ALT 11  ALKPHOS 80  BILITOT 0.6  PROT 8.5*  ALBUMIN 4.2   No results found for this basename: LIPASE, AMYLASE,  in the last 168 hours No results found for this basename: AMMONIA,  in the last 168 hours CBC:  Recent Labs Lab 10-24-2013 1107  WBC 26.2*  NEUTROABS 23.4*  HGB 16.1*  HCT 48.4*  MCV 90.6  PLT 280   Cardiac Enzymes: No results found for this basename: CKTOTAL, CKMB, CKMBINDEX, TROPONINI,  in the last 168 hours  BNP (last 3 results) No results found for this basename: PROBNP,  in the last 8760 hours CBG: No results found for this basename: GLUCAP,  in the last 168 hours  Radiological Exams on Admission: Ct Head Wo Contrast  10/24/13   CLINICAL DATA:  Fall with head injury.  EXAM: CT HEAD WITHOUT CONTRAST  CT MAXILLOFACIAL WITHOUT CONTRAST  CT CERVICAL SPINE WITHOUT CONTRAST  TECHNIQUE: Multidetector CT imaging of the head, cervical spine, and maxillofacial structures were performed using the standard protocol without intravenous contrast. Multiplanar CT image reconstructions of the cervical spine and maxillofacial structures were also generated.  COMPARISON:  Head CT 02/25/2009  FINDINGS: CT HEAD FINDINGS  There is acute subarachnoid hemorrhage, most prominent in the basilar cisterns and scattered in some of the hemispheric sulci and in the left sylvian fissure. There is also intraventricular hemorrhage in the left lateral ventricle. Ventricular size appears similar to a head CT of 2010. Ventricles are slightly prominent size, but chronic and likely related to the patient's diffuse cerebral atrophy. No definite acute hydrocephalus. There is no midline shift or mass effect. Chronic  microvascular ischemic changes in the deep periventricular white matter  are stable. No evidence of acute cortically based infarction.  The bony calvarium is intact.  CT MAXILLOFACIAL FINDINGS  There is prominent soft tissue swelling/ hematoma of the left face, most prominent lateral to the left orbit and along the left side of the mandible. The orbits are symmetric and intact. No evidence of acute oral injury. Facial bones are intact. No acute facial bone fracture. Paranasal sinuses are clear. The mastoid air cells and middle ears are clear.  CT CERVICAL SPINE FINDINGS  Cervical spine is imaged from the skullbase through the T1-T2 interspace. Vertebral bodies are normal in height and alignment. There is multilevel disc height narrowing and osteophyte formation, most prominent at C5-C6. Negative for acute cervical spine fracture. There is facet joint hypertrophy at multiple levels, resulting in some neural foraminal narrowing, most prominent bilaterally at C3-C4. The spinal canal is patent. The prevertebral soft tissue contour is normal.  IMPRESSION: 1. Acute subarachnoid and intraventricular hemorrhage. I called this critical value to Dr. Fonnie Jarvis in the Emergency department at 12:15 p.m. on 09/30/2013. 2. Chronic cerebral atrophy and mild chronic ventriculomegaly, likely ex vacuo. 3. Soft tissue swelling/hematoma formation of the left face lateral to the left orbit and adjacent to the left mandible. 4. Negative for facial bone fracture. 5. Negative for cervical spine fracture. 6. Multilevel degenerative changes of the cervical spine.   Electronically Signed   By: Britta Mccreedy M.D.   On: 10/03/2013 12:17   Ct Cervical Spine Wo Contrast  10/07/2013   CLINICAL DATA:  Fall with head injury.  EXAM: CT HEAD WITHOUT CONTRAST  CT MAXILLOFACIAL WITHOUT CONTRAST  CT CERVICAL SPINE WITHOUT CONTRAST  TECHNIQUE: Multidetector CT imaging of the head, cervical spine, and maxillofacial structures were performed using the  standard protocol without intravenous contrast. Multiplanar CT image reconstructions of the cervical spine and maxillofacial structures were also generated.  COMPARISON:  Head CT 02/25/2009  FINDINGS: CT HEAD FINDINGS  There is acute subarachnoid hemorrhage, most prominent in the basilar cisterns and scattered in some of the hemispheric sulci and in the left sylvian fissure. There is also intraventricular hemorrhage in the left lateral ventricle. Ventricular size appears similar to a head CT of 2010. Ventricles are slightly prominent size, but chronic and likely related to the patient's diffuse cerebral atrophy. No definite acute hydrocephalus. There is no midline shift or mass effect. Chronic microvascular ischemic changes in the deep periventricular white matter are stable. No evidence of acute cortically based infarction.  The bony calvarium is intact.  CT MAXILLOFACIAL FINDINGS  There is prominent soft tissue swelling/ hematoma of the left face, most prominent lateral to the left orbit and along the left side of the mandible. The orbits are symmetric and intact. No evidence of acute oral injury. Facial bones are intact. No acute facial bone fracture. Paranasal sinuses are clear. The mastoid air cells and middle ears are clear.  CT CERVICAL SPINE FINDINGS  Cervical spine is imaged from the skullbase through the T1-T2 interspace. Vertebral bodies are normal in height and alignment. There is multilevel disc height narrowing and osteophyte formation, most prominent at C5-C6. Negative for acute cervical spine fracture. There is facet joint hypertrophy at multiple levels, resulting in some neural foraminal narrowing, most prominent bilaterally at C3-C4. The spinal canal is patent. The prevertebral soft tissue contour is normal.  IMPRESSION: 1. Acute subarachnoid and intraventricular hemorrhage. I called this critical value to Dr. Fonnie Jarvis in the Emergency department at 12:15 p.m. on 09/26/2013. 2. Chronic cerebral  atrophy and  mild chronic ventriculomegaly, likely ex vacuo. 3. Soft tissue swelling/hematoma formation of the left face lateral to the left orbit and adjacent to the left mandible. 4. Negative for facial bone fracture. 5. Negative for cervical spine fracture. 6. Multilevel degenerative changes of the cervical spine.   Electronically Signed   By: Britta Mccreedy M.D.   On: 06-Nov-2013 12:17   Dg Pelvis Portable  2013/11/06   CLINICAL DATA:  Fall.  History of left hip pinning and 2009  EXAM: PORTABLE PELVIS 1-2 VIEWS  COMPARISON:  Pelvis radiographs 02/25/2009  FINDINGS: There is no evidence of pelvic fracture or diastasis. No other pelvic bone lesions are seen. Three screws traverse the proximal left femur, unchanged. No evidence of hardware failure. Postoperative changes in the pubic bones are stable and suggest prior pelvic suspension procedure. Probable convex left scoliosis of the lumbar spine, partially visualized. Prominent amount of stool in the rectum.  IMPRESSION: No acute bony abnormality identified. Postoperative changes of the left hip and pubic symphysis region.   Electronically Signed   By: Britta Mccreedy M.D.   On: 2013-11-06 13:02   Dg Hand 2 View Right  Nov 06, 2013   CLINICAL DATA:  Bruising post fall  EXAM: RIGHT HAND - 2 VIEW  COMPARISON:  None.  FINDINGS: Mild osteopenia. There is no evidence of fracture or dislocation. There is no evidence of arthropathy or other focal bone abnormality. Soft tissues are unremarkable.  IMPRESSION: Negative.   Electronically Signed   By: Oley Balm M.D.   On: Nov 06, 2013 13:02   Dg Chest Port 1 View  2013/11/06   CLINICAL DATA:  Larey Seat, dementia  EXAM: PORTABLE CHEST - 1 VIEW  COMPARISON:  10/04/2013  FINDINGS: Left shoulder arthroplasty hardware partially seen. Stable fracture deformities in the right shoulder. Relatively low lung volumes. Coarse interstitial and airspace opacities in both lungs with more confluent disease in the right mid lung and at the  left lung base, slightly worse than on prior exam. Blunting of lateral costophrenic angle suggesting small effusions. Heart size upper limits normal for technique. Tortuous atheromatous aorta. Surgical clips in the right upper abdomen. Changes of T12 kyphoplasty.  IMPRESSION: 1. Worsening asymmetric airspace disease, possible small effusions. 2. Chronic and postoperative changes as above.   Electronically Signed   By: Oley Balm M.D.   On: 2013/11/06 13:01   Ct Maxillofacial Wo Cm  06-Nov-2013   CLINICAL DATA:  Fall with head injury.  EXAM: CT HEAD WITHOUT CONTRAST  CT MAXILLOFACIAL WITHOUT CONTRAST  CT CERVICAL SPINE WITHOUT CONTRAST  TECHNIQUE: Multidetector CT imaging of the head, cervical spine, and maxillofacial structures were performed using the standard protocol without intravenous contrast. Multiplanar CT image reconstructions of the cervical spine and maxillofacial structures were also generated.  COMPARISON:  Head CT 02/25/2009  FINDINGS: CT HEAD FINDINGS  There is acute subarachnoid hemorrhage, most prominent in the basilar cisterns and scattered in some of the hemispheric sulci and in the left sylvian fissure. There is also intraventricular hemorrhage in the left lateral ventricle. Ventricular size appears similar to a head CT of 2010. Ventricles are slightly prominent size, but chronic and likely related to the patient's diffuse cerebral atrophy. No definite acute hydrocephalus. There is no midline shift or mass effect. Chronic microvascular ischemic changes in the deep periventricular white matter are stable. No evidence of acute cortically based infarction.  The bony calvarium is intact.  CT MAXILLOFACIAL FINDINGS  There is prominent soft tissue swelling/ hematoma of the left face, most prominent  lateral to the left orbit and along the left side of the mandible. The orbits are symmetric and intact. No evidence of acute oral injury. Facial bones are intact. No acute facial bone fracture.  Paranasal sinuses are clear. The mastoid air cells and middle ears are clear.  CT CERVICAL SPINE FINDINGS  Cervical spine is imaged from the skullbase through the T1-T2 interspace. Vertebral bodies are normal in height and alignment. There is multilevel disc height narrowing and osteophyte formation, most prominent at C5-C6. Negative for acute cervical spine fracture. There is facet joint hypertrophy at multiple levels, resulting in some neural foraminal narrowing, most prominent bilaterally at C3-C4. The spinal canal is patent. The prevertebral soft tissue contour is normal.  IMPRESSION: 1. Acute subarachnoid and intraventricular hemorrhage. I called this critical value to Dr. Fonnie Jarvis in the Emergency department at 12:15 p.m. on 09/20/2013. 2. Chronic cerebral atrophy and mild chronic ventriculomegaly, likely ex vacuo. 3. Soft tissue swelling/hematoma formation of the left face lateral to the left orbit and adjacent to the left mandible. 4. Negative for facial bone fracture. 5. Negative for cervical spine fracture. 6. Multilevel degenerative changes of the cervical spine.   Electronically Signed   By: Britta Mccreedy M.D.   On: 09/30/2013 12:17      Assessment/Plan Active Problems:   DEMENTIA, SENILE, UNCOMPLICATED   Palliative care encounter   SAH (subarachnoid hemorrhage)   1. SAH- non-operable; pallaitive care consult for help with transition- daughter spoke with ER doctor, not present in room 2. Dementia 3. ST seg elevation- from [facility] 4. Lacerations, facial- to be repaired in ER  Palliative care  Code Status: DNR Family Communication:  Disposition Plan: admit for palliative  Time spent: 75 min  Jontue Crumpacker [facility] Pager [phone]  If 7PM-7AM, please contact night-coverage [email or web address] Password [email or web address] 10/03/2013, 1:27 PM

## 2013-10-19 NOTE — ED Notes (Signed)
Daughter and husband at bedside. Family updated on pt's status. Daughter reiterates that pt would want palliative care. Md Bednar at bedside discussing plan of care and treatment plan. Family in agreance.

## 2013-10-19 NOTE — ED Notes (Addendum)
Patient's Daughter Geraldine Contras):  Cell: [phone] Home: [phone]  Son-in-Law Smitty Cords): Cell: [phone]

## 2013-10-19 NOTE — ED Notes (Signed)
EMS found pt other shoe and sock.

## 2013-10-19 NOTE — ED Notes (Signed)
Pt HR increased to 120. Repeat EKG captured, showed to MD Bednar. New ST elevation noted.

## 2013-10-19 NOTE — ED Notes (Signed)
Received pt from [facility] with c/o pt found at bottom of 3 concrete steps face down. Pt last seen by staff 15 mins prior to fall. Pt was verbal with fire department on scene but has been nonverbal with 2nd ([facility]) and 3rd ([facility]) responders. Pt arrived with 1 sock and shoe missing from right foot. Pt has hemtoma to left side of face and lac to chin.

## 2013-10-19 NOTE — Discharge Summary (Signed)
Death Summary  [name]:[id number] DOB: 1920/06/24 DOA: Oct 27, 2013  PCP: Willow Ora, MD   Admit date: 27-Oct-2013 Date of Death: October 29, 2013  Final Diagnoses:  Principal Problem:   SAH (subarachnoid hemorrhage) Active Problems:   DEMENTIA, SENILE, UNCOMPLICATED   Palliative care encounter   Subarachnoid hemorrhage    SAH- non-operable; comfort care  Dementia  ST seg elevation- from [facility]  Lacerations, facial- to be repaired in ER    Time: 35 min  Signed:  Marlin Canary  [facility] October 29, 2013, 1:23 PM

## 2013-10-19 NOTE — ED Notes (Signed)
MD Fonnie Jarvis spoke with daughter, who requests palliative care for pt.

## 2013-10-19 NOTE — ED Provider Notes (Signed)
LACERATION REPAIR Date/Time: 11-01-13 3:42 PM Performed by: Santanna Olenik Authorized by: Hurman Horn Consent: Verbal consent obtained. Consent given by: guardian Body area: head/neck Laceration length: 1 cm Foreign bodies: no foreign bodies Tendon involvement: none Nerve involvement: none Vascular damage: no Anesthesia: local infiltration Local anesthetic: lidocaine 1% with epinephrine Anesthetic total: 3 ml Irrigation solution: saline Irrigation method: jet lavage Amount of cleaning: standard Debridement: none Degree of undermining: none Wound skin closure material used: 5-0 gut. Number of sutures: 4 Technique: simple Approximation: close Approximation difficulty: simple Patient tolerance: Patient tolerated the procedure well with no immediate complications.  LACERATION REPAIR Date/Time: November 01, 2013 3:43 PM Performed by: Derryck Shahan Authorized by: Hurman Horn Consent: Verbal consent obtained. Consent given by: guardian Body area: head/neck Location details: left eyebrow Laceration length: 1.5 cm Foreign bodies: no foreign bodies Tendon involvement: none Nerve involvement: none Anesthesia: local infiltration Local anesthetic: lidocaine 1% with epinephrine Anesthetic total: 4 ml Preparation: Patient was prepped and draped in the usual sterile fashion. Irrigation solution: saline Irrigation method: jet lavage Amount of cleaning: standard Wound skin closure material used: 5-0 gut. Number of sutures: 4 Technique: simple Approximation: close Approximation difficulty: simple Patient tolerance: Patient tolerated the procedure well with no immediate complications.     Bridgett Larsson, MD November 01, 2013 [phone]

## 2013-10-19 NOTE — ED Notes (Signed)
Admitting MD at bedside.

## 2013-10-19 NOTE — ED Notes (Signed)
Inserted size 16 french foley cath into patient cloudy urine in return

## 2013-10-19 NOTE — ED Notes (Signed)
Pt transported with primary RN to CT on monitor. Pt remains at [facility] of 11. Pt remains non verbal.

## 2013-10-19 NOTE — Consult Note (Signed)
Patient ZO:XWRU I Tanksley      DOB: 1920-01-31      EAV:[id number]     Consult Note from the Palliative Medicine Team at [facility]    Consult Requested by: Dr. Benjamine Mola     PCP: Willow Ora, MD Reason for Consultation: Symptom managment    Phone [phone] At the end of life Assessment of patients Current state: 79 yr old white female status post a fall down three stairs found to have [facility] and is now actively dying.  I spoke with her daughter by phone she is aware that her mom will likely die this evening. Her daughter just had surgery and will not be able to be at bedside and other family members live out of town.  Her daughter confirmed that they desire full comfort care and we discussed the need for continuous opiates at this time which she is agreeable to.  Patient having periods of apnea but when roused is moaning and in pain.   Goals of Care: 1.  Code Status: DNR/ DNI   2. Scope of Treatment: Full comfort. Treat pain and anxiety.   4. Disposition: Expecting a hospital death   3. Symptom Management:   1. Anxiety/Agitation: ativan 0.5 mg q 4 prn 2. Pain:Initiate low dose continuous morphine drip 0.5 mg/hr can titrate for worsening pain or increased work of breathing, and may use  boluses 3. Terminal Secretions: add atropine and scopolamine   4. Psychosocial:  Patient's daughter states that her mom has been asking the Lord to take her "home" for a long time and while they are sad it is an answer to her prayer.  5. Spiritual: family aware of chaplain support        Patient Documents Completed or Given: Document Given Completed  Advanced Directives Pkt    MOST    DNR    Gone from My Sight    Hard Choices      Brief HPI: 79 yr old white female with known history of dementia, ischemic cardiomyopathy was found at her nursing facility at the bottom of three concrete stairs face down.  She was seen in ER and found to have a subarachnoid hemorrhage.  She was initially  awake but lost consciousness while in transport.  We were asked to assist with symptom management at the end of life as her( Daughter represents herself as POA) family has requested comfort care only.     ROS: unable to obtain    PMH:  Past Medical History  Diagnosis Date  . Ischemic cardiomyopathy     This was presumed based on EKG and echocardiogram. Her EKG shows evidence for an old anterior MI. 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  Marland Kitchen Pulmonary fibrosis     COPD pt was on home 02 at one point at night  . Chronic UTI      sepsis- UTI w/ MS changes 10/09  . Hip fracture     s/p w/ pinning in Sept 2009  . Fall   . TIA (transient ischemic attack)   . GERD (gastroesophageal reflux disease)   . Macular degeneration     L blindness, R: Peripheral vision only  . Hypercholesterolemia   . Dementia     senile, uncomplicated   . Osteoarthritis     chronic neck pain  . Ischemic cardiomyopathy     Presumed based on EKG and echocardiogram; EKG shows evidence  for old anterior MY  . History of echocardiogram 1/11    EF 30%, apical aneurysm, mild LVH, grade I diastolic dysfunction, mild MR, no AS  . Bilateral pneumonia 04/25/2011  . CONGESTIVE HEART FAILURE 05/12/2007  . CONSTIPATION 11/07/2009  . COPD 05/12/2007  . CORONARY ARTERY DISEASE 08/01/2008  . Cough 09/29/2008  . DEMENTIA, SENILE, UNCOMPLICATED 08/15/2007  . DYSPHAGIA UNSPECIFIED 11/07/2009  . GAIT DISTURBANCE 08/17/2010  . GERD 12/31/2007  . HYPERLIPIDEMIA 05/22/2007  . INSOMNIA-SLEEP DISORDER-UNSPEC 04/28/2008  . MACULAR DEGENERATION 05/22/2007  . NECK PAIN, CHRONIC 08/12/2009  . OSTEOARTHRITIS 05/12/2007  . OTHER NONSPECIFIC FINDING EXAMINATION OF URINE 08/29/2009  . Polypharmacy 04/25/2011  . PRURITUS 05/30/2009  . PULMONARY FIBROSIS 05/22/2007  . SKIN LESION 01/27/2010  . SYSTOLIC MURMUR 11/07/2009  . TRANSIENT ISCHEMIC ATTACKS, HX OF 08/12/2009  . URINARY TRACT  INFECTION, RECURRENT 05/22/2007  . VERTEBRAL FRACTURE 06/20/2010  . WEIGHT LOSS 05/30/2009     PSH: Past Surgical History  Procedure Laterality Date  . Cholecystectomy    . Abdominal hysterectomy    . Oophorectomy    . Spinal fusion      x 3, lower spine  . Hip fracture surgery  07/2008   I have reviewed the FH and SH and  If appropriate update it with new information. Allergies  Allergen Reactions  . Isosorbide Mononitrate Hives and Itching    REACTION: itching   Scheduled Meds: . sodium chloride   Intravenous STAT  . sodium chloride   Intravenous STAT   Continuous Infusions: . morphine     PRN Meds:.atropine, LORazepam, morphine    BP 209/117  Pulse 102  Temp(Src) 96.4 F (35.8 C) (Axillary)  Resp 16  Wt 58.877 kg (129 lb 12.8 oz)  SpO2 100%   PPS: 5%   Intake/Output Summary (Last 24 hours) at 10/14/2013 1745 Last data filed at 10/18/2013 1610  Gross per 24 hour  Intake    500 ml  Output   3300 ml  Net  -2800 ml    Physical Exam:  General: Patient unresponsive, intermittent apnea alternating with snoring breath sounds and moaning HEENT:  Pupils lg irregular, fixed, left facial trauma , hematoma and sutures Chest:   Decreased with gasping sonorous breath sounds CVS: irregular, S1, and S2 Abdomen:soft, no grimace, positive slow bowel sounds Ext: multiple areas of ecchymosis Neuro:unresponsive and in pain with intermittent vocalizations   Labs: CBC    Component Value Date/Time   WBC 26.2* 10/01/2013 1107   RBC 5.34* 10/01/2013 1107   RBC 4.99 08/01/2008 1439   HGB 16.1* 09/23/2013 1107   HCT 48.4* 10/05/2013 1107   PLT 280 10/08/2013 1107   MCV 90.6 10/05/2013 1107   MCH 30.1 09/20/2013 1107   MCHC 33.3 09/24/2013 1107   RDW 14.1 10/05/2013 1107   LYMPHSABS 1.8 10/15/2013 1107   MONOABS 1.0 10/02/2013 1107   EOSABS 0.0 10/04/2013 1107   BASOSABS 0.0 09/28/2013 1107       CMP     Component Value Date/Time   NA 136 10/04/2013 1107   K 4.4  09/30/2013 1107   CL 97 09/28/2013 1107   CO2 25 10/08/2013 1107   GLUCOSE 108* 10/12/2013 1107   BUN 14 09/27/2013 1107   CREATININE 0.64 09/30/2013 1107   CREATININE 0.82 07/17/2013 1454   CALCIUM 10.5 10/16/2013 1107   PROT 8.5* 10/03/2013 1107   ALBUMIN 4.2 09/27/2013 1107   AST 27 09/22/2013 1107   ALT 11 10/16/2013 1107   ALKPHOS 80  11/10/2013 1107   BILITOT 0.6 Nov 10, 2013 1107   GFRNONAA 76* 2013/11/10 1107   GFRAA 88* 11/10/2013 1107    Chest Xray Reviewed/Impressions: 1. Worsening asymmetric airspace disease, possible small effusions.  2. Chronic and postoperative changes as above.   CT scan of the Head Reviewed/Impressions:  1. Acute subarachnoid and intraventricular hemorrhage. I called this  critical value to Dr. Fonnie Jarvis in the Emergency department at 12:15  p.m. on 11/10/2013.  2. Chronic cerebral atrophy and mild chronic ventriculomegaly,  likely ex vacuo.  3. Soft tissue swelling/hematoma formation of the left face lateral  to the left orbit and adjacent to the left mandible.  4. Negative for facial bone fracture.  5. Negative for cervical spine fracture.  6. Multilevel degenerative changes of the cervical spine.      Time In Time Out Total Time Spent with Patient Total Overall Time  510 pm 545 pm 20 min 35 min    Greater than 50%  of this time was spent counseling and coordinating care related to the above assessment and plan.  Senan Urey L. Ladona Ridgel, MD MBA The Palliative Medicine Team at [facility] Phone: [phone] Pager: [phone]

## 2013-10-19 DEATH — deceased

## 2013-11-19 DEATH — deceased
